# Patient Record
Sex: Male | Born: 1960 | Race: White | Hispanic: No | Marital: Married | Smoking: Current some day smoker
Health system: Southern US, Community
[De-identification: ages and names within clinical notes are randomized; demographics above are authoritative.]

## PROBLEM LIST (undated history)

## (undated) DIAGNOSIS — E785 Hyperlipidemia, unspecified: Secondary | ICD-10-CM

---

## 2019-06-02 DIAGNOSIS — E860 Dehydration: Secondary | ICD-10-CM

## 2019-06-02 DIAGNOSIS — J129 Viral pneumonia, unspecified: Secondary | ICD-10-CM

## 2019-06-02 DIAGNOSIS — N179 Acute kidney failure, unspecified: Secondary | ICD-10-CM | POA: Diagnosis not present

## 2019-06-02 DIAGNOSIS — J189 Pneumonia, unspecified organism: Secondary | ICD-10-CM

## 2019-06-02 DIAGNOSIS — M6282 Rhabdomyolysis: Secondary | ICD-10-CM

## 2019-06-02 DIAGNOSIS — A419 Sepsis, unspecified organism: Secondary | ICD-10-CM

## 2019-06-02 DIAGNOSIS — R740 Nonspecific elevation of levels of transaminase and lactic acid dehydrogenase [LDH]: Secondary | ICD-10-CM

## 2019-06-03 ENCOUNTER — Inpatient Hospital Stay (HOSPITAL_COMMUNITY)
Admission: AD | Admit: 2019-06-03 | Discharge: 2019-06-06 | DRG: 871 | Disposition: A | Discharge status: Home / Self Care | Payer: Commercial Managed Care - PPO | Source: Other Acute Inpatient Hospital | Attending: Family Medicine | Admitting: Family Medicine

## 2019-06-03 DIAGNOSIS — J181 Lobar pneumonia, unspecified organism: Secondary | ICD-10-CM | POA: Diagnosis not present

## 2019-06-03 DIAGNOSIS — J9600 Acute respiratory failure, unspecified whether with hypoxia or hypercapnia: Secondary | ICD-10-CM

## 2019-06-03 DIAGNOSIS — A4189 Other specified sepsis: Principal | ICD-10-CM | POA: Diagnosis present

## 2019-06-03 DIAGNOSIS — F17200 Nicotine dependence, unspecified, uncomplicated: Secondary | ICD-10-CM | POA: Diagnosis present

## 2019-06-03 DIAGNOSIS — F1010 Alcohol abuse, uncomplicated: Secondary | ICD-10-CM | POA: Diagnosis present

## 2019-06-03 DIAGNOSIS — A938 Other specified arthropod-borne viral fevers: Secondary | ICD-10-CM | POA: Diagnosis not present

## 2019-06-03 DIAGNOSIS — W57XXXA Bitten or stung by nonvenomous insect and other nonvenomous arthropods, initial encounter: Secondary | ICD-10-CM | POA: Diagnosis present

## 2019-06-03 DIAGNOSIS — N179 Acute kidney failure, unspecified: Secondary | ICD-10-CM | POA: Diagnosis present

## 2019-06-03 DIAGNOSIS — Z20828 Contact with and (suspected) exposure to other viral communicable diseases: Secondary | ICD-10-CM | POA: Diagnosis present

## 2019-06-03 DIAGNOSIS — A419 Sepsis, unspecified organism: Secondary | ICD-10-CM | POA: Diagnosis not present

## 2019-06-03 DIAGNOSIS — J9601 Acute respiratory failure with hypoxia: Secondary | ICD-10-CM

## 2019-06-03 DIAGNOSIS — E860 Dehydration: Secondary | ICD-10-CM | POA: Diagnosis present

## 2019-06-03 DIAGNOSIS — R7401 Elevation of levels of liver transaminase levels: Secondary | ICD-10-CM | POA: Diagnosis present

## 2019-06-03 DIAGNOSIS — R6520 Severe sepsis without septic shock: Secondary | ICD-10-CM

## 2019-06-03 DIAGNOSIS — M6282 Rhabdomyolysis: Secondary | ICD-10-CM | POA: Diagnosis present

## 2019-06-03 DIAGNOSIS — J1289 Other viral pneumonia: Secondary | ICD-10-CM | POA: Diagnosis present

## 2019-06-03 DIAGNOSIS — E785 Hyperlipidemia, unspecified: Secondary | ICD-10-CM | POA: Diagnosis present

## 2019-06-03 DIAGNOSIS — Z23 Encounter for immunization: Secondary | ICD-10-CM

## 2019-06-03 DIAGNOSIS — K760 Fatty (change of) liver, not elsewhere classified: Secondary | ICD-10-CM | POA: Diagnosis not present

## 2019-06-03 DIAGNOSIS — Z833 Family history of diabetes mellitus: Secondary | ICD-10-CM

## 2019-06-03 DIAGNOSIS — J189 Pneumonia, unspecified organism: Secondary | ICD-10-CM | POA: Diagnosis not present

## 2019-06-03 DIAGNOSIS — E871 Hypo-osmolality and hyponatremia: Secondary | ICD-10-CM | POA: Diagnosis present

## 2019-06-03 DIAGNOSIS — R740 Nonspecific elevation of levels of transaminase and lactic acid dehydrogenase [LDH]: Secondary | ICD-10-CM | POA: Diagnosis not present

## 2019-06-03 HISTORY — DX: Hyperlipidemia, unspecified: E78.5

## 2019-06-03 LAB — CBC WITH DIFFERENTIAL/PLATELET
Abs Immature Granulocytes: 0.09 10*3/uL — ABNORMAL HIGH (ref 0.00–0.07)
Basophils Absolute: 0 10*3/uL (ref 0.0–0.1)
Basophils Relative: 0 %
Eosinophils Absolute: 0 10*3/uL (ref 0.0–0.5)
Eosinophils Relative: 0 %
HCT: 36.2 % — ABNORMAL LOW (ref 39.0–52.0)
Hemoglobin: 12.7 g/dL — ABNORMAL LOW (ref 13.0–17.0)
Immature Granulocytes: 1 %
Lymphocytes Relative: 8 %
Lymphs Abs: 0.8 10*3/uL (ref 0.7–4.0)
MCH: 31.4 pg (ref 26.0–34.0)
MCHC: 35.1 g/dL (ref 30.0–36.0)
MCV: 89.6 fL (ref 80.0–100.0)
Monocytes Absolute: 0.5 10*3/uL (ref 0.1–1.0)
Monocytes Relative: 5 %
Neutro Abs: 9.1 10*3/uL — ABNORMAL HIGH (ref 1.7–7.7)
Neutrophils Relative %: 86 %
Platelets: 192 10*3/uL (ref 150–400)
RBC: 4.04 MIL/uL — ABNORMAL LOW (ref 4.22–5.81)
RDW: 12.5 % (ref 11.5–15.5)
WBC: 10.5 10*3/uL (ref 4.0–10.5)
nRBC: 0 % (ref 0.0–0.2)

## 2019-06-03 LAB — PROCALCITONIN: Procalcitonin: 2.17 ng/mL

## 2019-06-03 LAB — RESPIRATORY PANEL BY PCR

## 2019-06-03 LAB — COMPREHENSIVE METABOLIC PANEL
ALT: 57 U/L — ABNORMAL HIGH (ref 0–44)
AST: 91 U/L — ABNORMAL HIGH (ref 15–41)
Albumin: 2.7 g/dL — ABNORMAL LOW (ref 3.5–5.0)
Alkaline Phosphatase: 38 U/L (ref 38–126)
Anion gap: 10 (ref 5–15)
BUN: 25 mg/dL — ABNORMAL HIGH (ref 6–20)
CO2: 21 mmol/L — ABNORMAL LOW (ref 22–32)
Calcium: 8 mg/dL — ABNORMAL LOW (ref 8.9–10.3)
Chloride: 100 mmol/L (ref 98–111)
Creatinine, Ser: 1.4 mg/dL — ABNORMAL HIGH (ref 0.61–1.24)
GFR calc Af Amer: >60 mL/min (ref >60)
GFR calc non Af Amer: 55 mL/min — ABNORMAL LOW (ref >60)
Glucose, Bld: 169 mg/dL — ABNORMAL HIGH (ref 70–99)
Potassium: 4.5 mmol/L (ref 3.5–5.1)
Sodium: 131 mmol/L — ABNORMAL LOW (ref 135–145)
Total Bilirubin: 1.4 mg/dL — ABNORMAL HIGH (ref 0.3–1.2)
Total Protein: 6.5 g/dL (ref 6.5–8.1)

## 2019-06-03 LAB — TROPONIN I (HIGH SENSITIVITY): Troponin I (High Sensitivity): 14 ng/L (ref <18)

## 2019-06-03 LAB — MRSA PCR SCREENING: MRSA by PCR: NEGATIVE

## 2019-06-03 LAB — BRAIN NATRIURETIC PEPTIDE: B Natriuretic Peptide: 47 pg/mL (ref 0.0–100.0)

## 2019-06-03 LAB — D-DIMER, QUANTITATIVE: D-Dimer, Quant: 3.88 ug/mL-FEU — ABNORMAL HIGH (ref 0.00–0.50)

## 2019-06-03 LAB — LACTATE DEHYDROGENASE: LDH: 348 U/L — ABNORMAL HIGH (ref 98–192)

## 2019-06-03 LAB — FIBRINOGEN: Fibrinogen: >800 mg/dL — ABNORMAL HIGH (ref 210–475)

## 2019-06-03 LAB — SEDIMENTATION RATE: Sed Rate: 77 mm/hr — ABNORMAL HIGH (ref 0–16)

## 2019-06-03 LAB — PROTIME-INR
INR: 1.1 (ref 0.8–1.2)
Prothrombin Time: 13.7 seconds (ref 11.4–15.2)

## 2019-06-03 LAB — C-REACTIVE PROTEIN: CRP: 28.5 mg/dL — ABNORMAL HIGH (ref <1.0)

## 2019-06-03 LAB — SARS CORONAVIRUS 2 BY RT PCR (HOSPITAL ORDER, PERFORMED IN ~~LOC~~ HOSPITAL LAB): SARS Coronavirus 2: NEGATIVE

## 2019-06-03 LAB — INFLUENZA PANEL BY PCR (TYPE A & B)
Influenza A By PCR: NEGATIVE
Influenza B By PCR: NEGATIVE

## 2019-06-03 LAB — LACTIC ACID, PLASMA: Lactic Acid, Venous: 2 mmol/L (ref 0.5–1.9)

## 2019-06-03 LAB — TRIGLYCERIDES: Triglycerides: 213 mg/dL — ABNORMAL HIGH (ref <150)

## 2019-06-03 LAB — FERRITIN: Ferritin: 2789 ng/mL — ABNORMAL HIGH (ref 24–336)

## 2019-06-03 LAB — APTT: aPTT: 33 seconds (ref 24–36)

## 2019-06-03 MED ORDER — ZINC SULFATE 220 (50 ZN) MG PO CAPS
220.0000 mg | ORAL_CAPSULE | Freq: Every day | ORAL | Status: DC
  Administered 2019-06-03 – 2019-06-06 (×4): 220 mg via ORAL
  Filled 2019-06-03 (×4): qty 1

## 2019-06-03 MED ORDER — ORAL CARE MOUTH RINSE
15.0000 mL | Freq: Two times a day (BID) | OROMUCOSAL | Status: DC
  Administered 2019-06-03 – 2019-06-06 (×6): 15 mL via OROMUCOSAL

## 2019-06-03 MED ORDER — ONDANSETRON HCL 4 MG/2ML IJ SOLN: 4.0000 mg | Freq: Four times a day (QID) | INTRAMUSCULAR | Status: DC | PRN

## 2019-06-03 MED ORDER — VITAMIN C 500 MG PO TABS
500.0000 mg | ORAL_TABLET | Freq: Every day | ORAL | Status: DC
  Administered 2019-06-03 – 2019-06-06 (×4): 500 mg via ORAL
  Filled 2019-06-03 (×4): qty 1

## 2019-06-03 MED ORDER — ACETAMINOPHEN 325 MG PO TABS
650.0000 mg | ORAL_TABLET | Freq: Four times a day (QID) | ORAL | Status: DC | PRN
  Filled 2019-06-03: qty 2

## 2019-06-03 MED ORDER — SODIUM CHLORIDE 0.9 % IV SOLN
2.0000 g | INTRAVENOUS | Status: DC
  Administered 2019-06-03 – 2019-06-05 (×3): 2 g via INTRAVENOUS
  Filled 2019-06-03 (×2): qty 2
  Filled 2019-06-03 (×2): qty 20

## 2019-06-03 MED ORDER — LORAZEPAM 1 MG PO TABS: 1.0000 mg | ORAL_TABLET | Freq: Four times a day (QID) | ORAL | Status: DC | PRN

## 2019-06-03 MED ORDER — VITAMIN B-1 100 MG PO TABS
100.0000 mg | ORAL_TABLET | Freq: Every day | ORAL | Status: DC
  Administered 2019-06-03 – 2019-06-06 (×4): 100 mg via ORAL
  Filled 2019-06-03 (×4): qty 1

## 2019-06-03 MED ORDER — FOLIC ACID 1 MG PO TABS
1.0000 mg | ORAL_TABLET | Freq: Every day | ORAL | Status: DC
  Administered 2019-06-03 – 2019-06-06 (×4): 1 mg via ORAL
  Filled 2019-06-03 (×4): qty 1

## 2019-06-03 MED ORDER — ENOXAPARIN SODIUM 60 MG/0.6ML ~~LOC~~ SOLN
55.0000 mg | SUBCUTANEOUS | Status: DC
  Administered 2019-06-03 – 2019-06-05 (×3): 55 mg via SUBCUTANEOUS
  Filled 2019-06-03 (×3): qty 0.6

## 2019-06-03 MED ORDER — GUAIFENESIN-DM 100-10 MG/5ML PO SYRP
10.0000 mL | ORAL_SOLUTION | ORAL | Status: DC | PRN
  Filled 2019-06-03: qty 10

## 2019-06-03 MED ORDER — DOXYCYCLINE HYCLATE 100 MG PO TABS
100.0000 mg | ORAL_TABLET | Freq: Two times a day (BID) | ORAL | Status: DC
  Administered 2019-06-03 – 2019-06-06 (×6): 100 mg via ORAL
  Filled 2019-06-03 (×6): qty 1

## 2019-06-03 MED ORDER — LORAZEPAM 2 MG/ML IJ SOLN
0.0000 mg | Freq: Four times a day (QID) | INTRAMUSCULAR | Status: AC
  Administered 2019-06-04: 2 mg via INTRAVENOUS
  Filled 2019-06-03: qty 1

## 2019-06-03 MED ORDER — THIAMINE HCL 100 MG/ML IJ SOLN
100.0000 mg | Freq: Every day | INTRAMUSCULAR | Status: DC
  Filled 2019-06-03: qty 2

## 2019-06-03 MED ORDER — PNEUMOCOCCAL VAC POLYVALENT 25 MCG/0.5ML IJ INJ
0.5000 mL | INJECTION | INTRAMUSCULAR | Status: AC
  Administered 2019-06-04: 0.5 mL via INTRAMUSCULAR

## 2019-06-03 MED ORDER — SODIUM CHLORIDE 0.9 % IV SOLN
INTRAVENOUS | Status: DC
  Administered 2019-06-03: via INTRAVENOUS

## 2019-06-03 MED ORDER — ONDANSETRON HCL 4 MG PO TABS: 4.0000 mg | ORAL_TABLET | Freq: Four times a day (QID) | ORAL | Status: DC | PRN

## 2019-06-03 MED ORDER — LORAZEPAM 2 MG/ML IJ SOLN: 1.0000 mg | Freq: Four times a day (QID) | INTRAMUSCULAR | Status: DC | PRN

## 2019-06-03 MED ORDER — SODIUM CHLORIDE 0.9% FLUSH
3.0000 mL | Freq: Two times a day (BID) | INTRAVENOUS | Status: DC
  Administered 2019-06-03 – 2019-06-06 (×6): 3 mL via INTRAVENOUS

## 2019-06-03 MED ORDER — ADULT MULTIVITAMIN W/MINERALS CH
1.0000 | ORAL_TABLET | Freq: Every day | ORAL | Status: DC
  Administered 2019-06-03 – 2019-06-06 (×4): 1 via ORAL
  Filled 2019-06-03 (×4): qty 1

## 2019-06-03 MED ORDER — HYDROCODONE-ACETAMINOPHEN 5-325 MG PO TABS: 1.0000 | ORAL_TABLET | ORAL | Status: DC | PRN

## 2019-06-03 MED ORDER — LORAZEPAM 2 MG/ML IJ SOLN
0.0000 mg | Freq: Two times a day (BID) | INTRAMUSCULAR | Status: DC
  Administered 2019-06-06: 1 mg via INTRAVENOUS
  Filled 2019-06-03: qty 1

## 2019-06-03 NOTE — H&P (Signed)
[name] ESP:[id number] DOB: 21-May-1961 DOA: 06/03/2019     PCP: Patient, No Pcp Per   Outpatient Specialists:  NONE    Patient coming from: home Lives  With family    Chief Complaint: transfer from [place] for possible Covid  HPI: Kenneth Mejia is a 58 y.o. male with medical history significant of HLD, tobacco abuse    Presented with   with  flu-like symptoms and febrile with a T-max of 106 to [place]  prior  patient had outpatient COVID swab done 4 days prior to admission that was negative.  Rapid swab done  At [facility] also negative however blood work very suspicious  ferritin elevated at 1150, LDH elevated at 993, total creatinine kinase 2472, C-reactive protein greater than 270 with left-sided pneumonia seen on imaging.  Given worsening inflammatory  markers and febrile state transfered to [facility] for further care.   Continues to have a cough  Got sick on 8/12, he works as a Mining engineer,  Not much exposure to people family been healthy His coworker had a ride with him  Brief narrative/HPI from [place]  Patient    presented to the ER after worsening of flu-like symptoms ongoing for 4 days.  Symptoms include loss of taste, fevers, body aches with nausea and vomiting.  Patient had outpatient COVID testing performed 24 hours after symptom onset with test results pending.  Fever in triage was 103.4.  Patient was not hypoxic at rest although while febrile was quite diaphoretic.  Chest x-ray revealed left middle lobe pneumonia.  Multiple inflammatory markers were elevated, CPK elevated.  COVID-19 rapid screen was negative.  Patient has been started on Zithromax and Rocephin for community-acquired pneumonia.  Of note white count is normal with left shift as well as lymphopenia.  O2 sats on room air 93%.  In addition to mentioned lab abnormalities patient had hyponatremia and acute kidney injury with a creatinine of 1.3.    Hospital course As above patient  presented to the emergency department with complaints of worsening flu-like symptoms and significantly elevated temperature with T-max of 106 prior to admission.  Workup during admission has revealed left sided pneumonia/possible viral pneumonia with acute kidney injury and mild rhabdomyolysis.  Per both patient and family symptoms began last Wednesday with progressively worsening temperature and mild confusion prior to admission.  Patient reports cough, and mild shortness of breath with associated fever.  He denies any known COVID-19 exposure, spouse denies any additional family members with any symptoms as well.  Inflammatory markers remain elevated including ferritin greater than 1150, LDH of 993, CRP greater than 270        SIRS/ Mild non hypoxemic respiratory failure secondary to left-sided pneumonia/viral pneumonia   with associated acute kidney injury and rhabdomyolysis. SARS-CoV-2 RNA PCR negative but high index of suspicion that this may be a false negative therefore nursing staff have initiated appropriate isolation procedures and -patient will be retested for COVID within the next 24-48 hours especially if symptoms do not improve or inflammatory markers worsen -Procalcitonin elevated at 1.91 > 3.20 -continue to trend -LDH 691 > 993 -total creatinine kinase 1157 > 2472 -CRP greater than 270 x2 -Continue IV Rocephin and Zithromax -continue IV hydration -IV Solu-Medrol 40 mg q.12 hours to treat possible COVID etiology to symptoms -Obtain respiratory viral profile to rule out influenza or other respiratory viral etiology -Lactic acid 1.2 > 1.2 >3.1 > 2.4 > 1.6 -Tylenol, Zofran and Phenergan for symptom management  Acute kidney injury/rhabdomyolysis Creatinine on admission 1.3 with no baseline labs available but patient and family deny any history of renal disease -8/17 creatinine remains elevated at 1.30 with GFR 57 -continue IV fluid hydration -Avoid offending medications -urine  myoglobin pending -serum myoglobin 599 -total creatinine kinase 1157 > 2472  Transaminitis Patient admits to drinking 2 cans of beer a day Mild elevation in total bilirubin in AST likely related to alcohol use although patient states he has not had any alcoholic beverages for 5 days -Continue to trend-potentially related to infectious process/dehydration -CIWA scale q.4 hours   tick bite was 2 nd July Both patient and family report recent imbedded tick bite to back area with rash. -continue doxycycline -Rocky mountain spotted fever and lyme disease workup pending  Dehydration with hyponatremia IV fluids given    Consultants: None   Microbiological data: SARS-CoV-2 RNA PCR negative Blood cultures pending  Antibiotics:  8/16 IV Rocephin and Zithromax > 8/16 8/16 IV doxycycline    Radiological studies: Chest x-ray Left midlung consolidation, consistent with pneumonia. Recommend chest radiographic follow-up to confirm resolution.   Infectious risk factors:  Reports  fever, shortness of breath, dry cough, chest pain,  anosmia/change in taste, N/V/Diarrhea/abdominal pain,  Body aches, severe fatigue     In  ER RAPID COVID TEST NEGATIVE repeatedly but high suspicion   Regarding pertinent Chronic problems:     Hyperlipidemia - diet controlled    While in ER:  The following Work up has been ordered so far:  Orders Placed This Encounter  Procedures   MRSA PCR Screening   Culture, blood (Routine X 2) w Reflex to ID Panel   Culture, sputum-assessment   SARS Coronavirus 2 [facility] order, Performed in [facility] hospital lab) Nasopharyngeal Nasopharyngeal Swab   Respiratory Panel by PCR   US Abdomen Limited RUQ   HIV antibody (Routine Testing)   CBC with Differential/Platelet   Comprehensive metabolic panel   C-reactive protein   CK   D-dimer, quantitative (not at [facility])   Ferritin   Interleukin-6, Plasma   Magnesium   Strep pneumoniae urinary  antigen   Influenza panel by PCR (type A & B)   Brain natriuretic peptide   C-reactive protein   Comprehensive metabolic panel   CBC with Differential/Platelet   D-dimer, quantitative (not at [facility])   Ferritin   Fibrinogen   Hepatitis B surface antigen   Interleukin-6, Plasma   Lactate dehydrogenase   Procalcitonin   Sedimentation rate   Triglycerides   Phosphorus   Rocky mtn spotted fvr abs pnl(IgG+IgM)   Ehrlichia antibody panel   Lactic acid, plasma   Protime-INR   APTT   Hepatitis panel, acute   Prealbumin   Lipid panel   Diet Heart Room service appropriate? Yes; Fluid consistency: Thin   Cardiac monitoring   Complete oral assessment tool on admission, transfer and at any change in patient condition.   Oral care with mouthwash and mouth/lip moisturizer as needed   Perform a daily oral assessment to evaluate integrity of the oral cavity/mucosa   Cardiac Monitoring - Continuous Indefinite   Maintain IV access   Notify physician   Up with assistance   Novel Coronavirus PPE supplies (droplet and contact precautions) yellow stethoscopes, surgical mask, gowns, surgical caps, face shield, goggles, CAPR - on the floor/unit, cleaning Sani-Cloth (orange and purple top)   Place COVID-19 isolation sign and PPE checklist outside the DOOR   Do not give nonsteroidal anti-inflammatory drugs (NSAIDs)   Patient  to wear surgical mask during transportation   Place working phone next to the patient   Assess patient for ability to self-prone. If able (can move self in bed, ambulate) and stable (SpO2 and oxygen requirement):   May go off telemetry for tests/procedures   Initiate Oral Care Protocol   Initiate Carrier Fluid Protocol   RN may order General Admission PRN Orders utilizing "General Admission PRN medications" (through manage orders) for the following patient needs: allergy symptoms (Claritin), cold sores (Carmex), cough (Robitussin DM), eye  irritation (Liquifilm Tears), hemorrhoids (Tucks), indigestion (Maalox), minor skin irritation (Hydrocortisone Cream), muscle pain Suezanne Jacquet Gay), nose irritation (saline nasal spray) and sore throat (Chloraseptic spray).   Notify MD for:   [place] Withdrawal Assessment (CIWA)   Notify MD if CIWA-AR is greater than 10 for 2 consecutive assessments.   If lactate (lactic acid) >2, verify repeat lactic acid order has been placed to be drawn   Document vital signs within 1-hour of fluid bolus completion and notify provider of bolus completion   Refer to Sidebar Report: Sepsis Sidebar   Vital signs   Vital signs   Vital signs   Full code   Pharmacy Consult   Airborne and Contact precautions   Pulse oximetry check with vital signs   Flutter valve   Oxygen therapy Mode or (Route): Nasal cannula; Liters Per Minute: 2; Keep 02 saturation: greater than 92 %   EKG 12-Lead   ABO/Rh   Admit to Inpatient (patient's expected length of stay will be greater than 2 midnights or inpatient only procedure)     Following Medications were ordered in ER: Medications  pneumococcal 23 valent vaccine (PNU-IMMUNE) injection 0.5 mL (has no administration in time range)  MEDLINE mouth rinse (15 mLs Mouth Rinse Given 06/03/19 2228)  enoxaparin (LOVENOX) injection 55 mg (55 mg Subcutaneous Given 06/03/19 2342)  sodium chloride flush (NS) 0.9 % injection 3 mL (3 mLs Intravenous Given 06/03/19 2228)  guaiFENesin-dextromethorphan (ROBITUSSIN DM) 100-10 MG/5ML syrup 10 mL (has no administration in time range)  vitamin C (ASCORBIC ACID) tablet 500 mg (500 mg Oral Given 06/03/19 2223)  zinc sulfate capsule 220 mg (220 mg Oral Given 06/03/19 2228)  acetaminophen (TYLENOL) tablet 650 mg (has no administration in time range)  HYDROcodone-acetaminophen (NORCO/VICODIN) 5-325 MG per tablet 1-2 tablet (has no administration in time range)  ondansetron (ZOFRAN) tablet 4 mg (has no administration in time range)      Or  ondansetron (ZOFRAN) injection 4 mg (has no administration in time range)  0.9 %  sodium chloride infusion ( Intravenous New Bag/Given 06/03/19 2336)  cefTRIAXone (ROCEPHIN) 2 g in sodium chloride 0.9 % 100 mL IVPB (2 g Intravenous New Bag/Given 06/03/19 2336)  doxycycline (VIBRA-TABS) tablet 100 mg (100 mg Oral Given 06/03/19 2223)  LORazepam (ATIVAN) tablet 1 mg (has no administration in time range)    Or  LORazepam (ATIVAN) injection 1 mg (has no administration in time range)  thiamine (VITAMIN B-1) tablet 100 mg (100 mg Oral Given 06/03/19 2223)    Or  thiamine (B-1) injection 100 mg ( Intravenous See Alternative 1/91/47 8295)  folic acid (FOLVITE) tablet 1 mg (1 mg Oral Given 06/03/19 2224)  multivitamin with minerals tablet 1 tablet (1 tablet Oral Given 06/03/19 2223)  LORazepam (ATIVAN) injection 0-4 mg (0 mg Intravenous Not Given 06/03/19 2228)    Followed by  LORazepam (ATIVAN) injection 0-4 mg (has no administration in time range)         Significant  initial  Findings: Abnormal Labs Reviewed  C-REACTIVE PROTEIN - Abnormal; Notable for the following components:      Result Value   CRP 28.5 (*)    All other components within normal limits  COMPREHENSIVE METABOLIC PANEL - Abnormal; Notable for the following components:   Sodium 131 (*)    CO2 21 (*)    Glucose, Bld 169 (*)    BUN 25 (*)    Creatinine, Ser 1.40 (*)    Calcium 8.0 (*)    Albumin 2.7 (*)    AST 91 (*)    ALT 57 (*)    Total Bilirubin 1.4 (*)    GFR calc non Af Amer 55 (*)    All other components within normal limits  CBC WITH DIFFERENTIAL/PLATELET - Abnormal; Notable for the following components:   RBC 4.04 (*)    Hemoglobin 12.7 (*)    HCT 36.2 (*)    Neutro Abs 9.1 (*)    Abs Immature Granulocytes 0.09 (*)    All other components within normal limits  D-DIMER, QUANTITATIVE (NOT AT [facility]) - Abnormal; Notable for the following components:   D-Dimer, Quant 3.88 (*)    All other components within  normal limits  FERRITIN - Abnormal; Notable for the following components:   Ferritin 2,789 (*)    All other components within normal limits  FIBRINOGEN - Abnormal; Notable for the following components:   Fibrinogen >800 (*)    All other components within normal limits  LACTATE DEHYDROGENASE - Abnormal; Notable for the following components:   LDH 348 (*)    All other components within normal limits  SEDIMENTATION RATE - Abnormal; Notable for the following components:   Sed Rate 77 (*)    All other components within normal limits  TRIGLYCERIDES - Abnormal; Notable for the following components:   Triglycerides 213 (*)    All other components within normal limits  LACTIC ACID, PLASMA - Abnormal; Notable for the following components:   Lactic Acid, Venous 2.0 (*)    All other components within normal limits     Otherwise labs showing:    Recent Labs  Lab 06/03/19 2220  NA 131*  K 4.5  CO2 21*  GLUCOSE 169*  BUN 25*  CREATININE 1.40*  CALCIUM 8.0*    Cr    stable,   Lab Results  Component Value Date   CREATININE 1.40 (H) 06/03/2019    Recent Labs  Lab 06/03/19 2220  AST 91*  ALT 57*  ALKPHOS 38  BILITOT 1.4*  PROT 6.5  ALBUMIN 2.7*   Lab Results  Component Value Date   CALCIUM 8.0 (L) 06/03/2019       WBC      Component Value Date/Time   WBC 10.5 06/03/2019 2220   ANC    Component Value Date/Time   NEUTROABS 9.1 (H) 06/03/2019 2220   ALC 0.8     Plt: Lab Results  Component Value Date   PLT 192 06/03/2019     Lactic Acid, Venous    Component Value Date/Time   LATICACIDVEN 1.8 06/03/2019 2359    Procalcitonin 2.17   COVID-19 Labs  Recent Labs    06/03/19 2220  DDIMER 3.88*  FERRITIN 2,789*  LDH 348*  CRP 28.5*    Lab Results  Component Value Date   SARSCOV2NAA NEGATIVE 06/03/2019       HG/HCT  stable,      Component Value Date/Time   HGB 12.7 (L) 06/03/2019 2220  HCT 36.2 (L) 06/03/2019 2220      Troponin 12>  14 Cardiac Panel (last 3 results)    BNP (last 3 results) Recent Labs    06/03/19 2220  BNP 47.0      UA ordered       ED Triage Vitals [06/03/19 2023]  Enc Vitals Group     BP 114/75     Pulse Rate 78     Resp (!) 24     Temp 98.2 F (36.8 C)     Temp Source Oral     SpO2 93 %     Weight 249 lb 1.9 oz (113 kg)     Height 6' (1.829 m)     Head Circumference      Peak Flow      Pain Score 0     Pain Loc      Pain Edu?      Excl. in [place]?   NIDP(82)@       Latest  Blood pressure 114/75, pulse 78, temperature 98.2 F (36.8 C), temperature source Oral, resp. rate (!) 24, height 6' (1.829 m), weight 113 kg, SpO2 93 %.     Hospitalist was called for admission for PNA with elevated infectious markers   Review of Systems:    Pertinent positives include: Fevers, chills, fatigue, shortness of breath at rest. dyspnea on exertion, Constitutional:  No weight loss, night sweats,  weight loss  HEENT:  No headaches, Difficulty swallowing,Tooth/dental problems,Sore throat,  No sneezing, itching, ear ache, nasal congestion, post nasal drip,  Cardio-vascular:  No chest pain, Orthopnea, PND, anasarca, dizziness, palpitations.no Bilateral lower extremity swelling  GI:  No heartburn, indigestion, abdominal pain, nausea, vomiting, diarrhea, change in bowel habits, loss of appetite, melena, blood in stool, hematemesis Resp:     No excess mucus, no productive cough, No non-productive cough, No coughing up of blood.No change in color of mucus.No wheezing. Skin:  no rash or lesions. No jaundice GU:  no dysuria, change in color of urine, no urgency or frequency. No straining to urinate.  No flank pain.  Musculoskeletal:  No joint pain or no joint swelling. No decreased range of motion. No back pain.  Psych:  No change in mood or affect. No depression or anxiety. No memory loss.  Neuro: no localizing neurological complaints, no tingling, no weakness, no double vision, no gait  abnormality, no slurred speech, no confusion  All systems reviewed and apart from [place] all are negative  Past Medical History:   Past Medical History:  Diagnosis Date   Hyperlipidemia       History reviewed. No pertinent surgical history.  Social History:  Ambulatory independently     reports that he has been smoking. He has never used smokeless tobacco. He reports current alcohol use of about 14.0 standard drinks of alcohol per week. No history on file for drug.   Family History:   Family History  Problem Relation Age of Onset   Diabetes Mother    Ovarian cancer Mother    Breast cancer Mother    Breast cancer Sister     Allergies: No Known Allergies   Prior to Admission medications   Not on File   Physical Exam: Blood pressure 114/75, pulse 78, temperature 98.2 F (36.8 C), temperature source Oral, resp. rate (!) 24, height 6' (1.829 m), weight 113 kg, SpO2 93 %. 1. General:  in No Acute distress   well  -appearing 2. Psychological: Alert and   Oriented  3. Head/ENT:     Dry Mucous Membranes                          Head Non traumatic, neck supple                            Poor Dentition 4. SKIN:   decreased Skin turgor,  Skin clean Dry and intact no rash 5. Heart: Regular rate and rhythm no  Murmur, no Rub or gallop 6. Lungs:   no wheezes some  crackles   7. Abdomen: Soft, non-tender, Non distended  obese  bowel sounds present 8. Lower extremities: no clubbing, cyanosis, no  edema 9. Neurologically Grossly intact, moving all 4 extremities equally  10. MSK: Normal range of motion   All other LABS:     Recent Labs  Lab 06/03/19 2220  WBC 10.5  NEUTROABS 9.1*  HGB 12.7*  HCT 36.2*  MCV 89.6  PLT 192     Recent Labs  Lab 06/03/19 2220  NA 131*  K 4.5  CL 100  CO2 21*  GLUCOSE 169*  BUN 25*  CREATININE 1.40*  CALCIUM 8.0*     Recent Labs  Lab 06/03/19 2220  AST 91*  ALT 57*  ALKPHOS 38  BILITOT 1.4*  PROT 6.5  ALBUMIN 2.7*        Cultures: No results found for: SDES, SPECREQUEST, CULT, REPTSTATUS   Radiological Exams on Admission: No results found.  Chart has been reviewed    Assessment/Plan  58 y.o. male with medical history significant of HLD, tobacco abuse     Admitted for PNA with elevated infectious markers  Present on Admission:  Sepsis ([place]) -  -SIRS criteria met with  fever.    With  vidence of end organ damage such as acute renal failure elevated lactic acid   -Most likely source being: pulmonary,   - Obtain serial lactic acid and procalcitonin level.  - Initiate IV antibiotics   - await results of blood and urine culture  - Rehydrate   Check HIV serologies  Acute respiratory failure with hypoxia - likely due to CAP,  Pt was on steroids for presumed Covid Would continue given worsening hypoxia Please reassess in AM  despite negative COVID result Given elevated pro calcitonin - bactereal infection could not be ruled out at this time Will repeat imaging given new hypoxia    CAP (community acquired pneumonia) -  - will admit for treatment of CAP will start on appropriate antibiotic coverage. Continue on airborne precautions for now given risk factors for COVID with history concerning    Obtain:  sputum cultures,                  Obtain respiratory panel and influenza serologies                  blood cultures                    strep pneumo UA antigen,                Provide oxygen as needed.    Tick borne fever  -prior history of tick exposure cannot rule out tickborne illness.  Continue with doxycycline for now   Rhabdomyolysis - noted to have elevated CK at [place] will continue to follow and rehydrate   AKI (acute kidney injury) ([place]) - unclear cr  at baseline, obtain urine elctrolytes and gently rehydrate   Transaminitis -we will further evaluate with right upper quadrant ultrasound tomorrow, hepatitis serology.  Patient states she only drinks 2 beers a day, LFT elevation  could be in the setting of viral infectious process as  well,    Elevated d-dimer -likely part of inflammatory process but patient would benefit from further imaging to evaluate pneumonia once creatinine stabilized could obtain CTA of the chest to eval for PE  Other plan as per orders.  DVT prophylaxis:  Lovenox     Code Status:  FULL CODE   as per patient   I had personally discussed CODE STATUS with patient   Family Communication:   Family not at  Bedside    Disposition Plan:     To home once workup is complete and patient is stable                                         Consults called: none  Admission status:  ED Disposition    None         inpatient     Expect 2 midnight stay secondary to severity of patient's current illness including   hemodynamic instability despite optimal treatment (tachycardia   Tachypnea )  Severe lab/radiological/exam abnormalities including:  CAP      I expect  patient to be hospitalized for 2 midnights requiring inpatient medical care.  Patient is at high risk for adverse outcome (such as loss of life or disability) if not treated.  Indication for inpatient stay as follows:    Hemodynamic instability despite maximal medical therapy,    Acute hypoxia  Need for IV antibiotics, IV fluids      Level of care    SDU tele indefinitely please discontinue once patient no longer qualifies  Precautions:   airborne Airborne and Contact precautions  PPE: Used by the provider:   P100  eye Goggles,  Gloves  gown      Kenneth Mejia 06/04/2019, 2:25 AM    [facility]     after 2 AM please page floor coverage PA If 7AM-7PM, please contact the day team taking care of the patient using [email or web address]

## 2019-06-04 ENCOUNTER — Encounter (HOSPITAL_COMMUNITY): Payer: Self-pay

## 2019-06-04 ENCOUNTER — Inpatient Hospital Stay (HOSPITAL_COMMUNITY): Payer: Commercial Managed Care - PPO

## 2019-06-04 ENCOUNTER — Other Ambulatory Visit: Payer: Self-pay

## 2019-06-04 DIAGNOSIS — N179 Acute kidney failure, unspecified: Secondary | ICD-10-CM | POA: Diagnosis present

## 2019-06-04 DIAGNOSIS — R7401 Elevation of levels of liver transaminase levels: Secondary | ICD-10-CM | POA: Diagnosis present

## 2019-06-04 DIAGNOSIS — M6282 Rhabdomyolysis: Secondary | ICD-10-CM | POA: Diagnosis present

## 2019-06-04 LAB — HIV ANTIBODY (ROUTINE TESTING W REFLEX): HIV Screen 4th Generation wRfx: NONREACTIVE

## 2019-06-04 LAB — EXPECTORATED SPUTUM ASSESSMENT W GRAM STAIN, RFLX TO RESP C: Special Requests: NORMAL

## 2019-06-04 LAB — CBC WITH DIFFERENTIAL/PLATELET
Abs Immature Granulocytes: 0 10*3/uL (ref 0.00–0.07)
Basophils Absolute: 0 10*3/uL (ref 0.0–0.1)
Basophils Relative: 0 %
Eosinophils Absolute: 0 10*3/uL (ref 0.0–0.5)
Eosinophils Relative: 0 %
HCT: 34.5 % — ABNORMAL LOW (ref 39.0–52.0)
Hemoglobin: 12.3 g/dL — ABNORMAL LOW (ref 13.0–17.0)
Lymphocytes Relative: 4 %
Lymphs Abs: 0.4 10*3/uL — ABNORMAL LOW (ref 0.7–4.0)
MCH: 31.7 pg (ref 26.0–34.0)
MCHC: 35.7 g/dL (ref 30.0–36.0)
MCV: 88.9 fL (ref 80.0–100.0)
Monocytes Absolute: 0.8 10*3/uL (ref 0.1–1.0)
Monocytes Relative: 9 %
Neutro Abs: 8.2 10*3/uL — ABNORMAL HIGH (ref 1.7–7.7)
Neutrophils Relative %: 87 %
Platelets: 196 10*3/uL (ref 150–400)
RBC: 3.88 MIL/uL — ABNORMAL LOW (ref 4.22–5.81)
RDW: 12.6 % (ref 11.5–15.5)
WBC: 9.4 10*3/uL (ref 4.0–10.5)
nRBC: 0 % (ref 0.0–0.2)
nRBC: 1 /100 WBC — ABNORMAL HIGH

## 2019-06-04 LAB — PHOSPHORUS: Phosphorus: 2.5 mg/dL (ref 2.5–4.6)

## 2019-06-04 LAB — COMPREHENSIVE METABOLIC PANEL
ALT: 58 U/L — ABNORMAL HIGH (ref 0–44)
AST: 79 U/L — ABNORMAL HIGH (ref 15–41)
Albumin: 2.5 g/dL — ABNORMAL LOW (ref 3.5–5.0)
Alkaline Phosphatase: 37 U/L — ABNORMAL LOW (ref 38–126)
Anion gap: 10 (ref 5–15)
BUN: 23 mg/dL — ABNORMAL HIGH (ref 6–20)
CO2: 20 mmol/L — ABNORMAL LOW (ref 22–32)
Calcium: 8 mg/dL — ABNORMAL LOW (ref 8.9–10.3)
Chloride: 102 mmol/L (ref 98–111)
Creatinine, Ser: 1.25 mg/dL — ABNORMAL HIGH (ref 0.61–1.24)
GFR calc Af Amer: >60 mL/min (ref >60)
GFR calc non Af Amer: >60 mL/min (ref >60)
Glucose, Bld: 178 mg/dL — ABNORMAL HIGH (ref 70–99)
Potassium: 4 mmol/L (ref 3.5–5.1)
Sodium: 132 mmol/L — ABNORMAL LOW (ref 135–145)
Total Bilirubin: 1.1 mg/dL (ref 0.3–1.2)
Total Protein: 6.1 g/dL — ABNORMAL LOW (ref 6.5–8.1)

## 2019-06-04 LAB — TROPONIN I (HIGH SENSITIVITY): Troponin I (High Sensitivity): 12 ng/L (ref <18)

## 2019-06-04 LAB — STREP PNEUMONIAE URINARY ANTIGEN: Strep Pneumo Urinary Antigen: NEGATIVE

## 2019-06-04 LAB — LIPID PANEL
Cholesterol: 148 mg/dL (ref 0–200)
HDL: 13 mg/dL — ABNORMAL LOW (ref >40)
LDL Cholesterol: 84 mg/dL (ref 0–99)
Total CHOL/HDL Ratio: 11.4 RATIO
Triglycerides: 257 mg/dL — ABNORMAL HIGH (ref <150)
VLDL: 51 mg/dL — ABNORMAL HIGH (ref 0–40)

## 2019-06-04 LAB — D-DIMER, QUANTITATIVE: D-Dimer, Quant: 3.13 ug/mL-FEU — ABNORMAL HIGH (ref 0.00–0.50)

## 2019-06-04 LAB — FERRITIN: Ferritin: 2746 ng/mL — ABNORMAL HIGH (ref 24–336)

## 2019-06-04 LAB — ABO/RH: ABO/RH(D): A POS

## 2019-06-04 LAB — MAGNESIUM: Magnesium: 2.2 mg/dL (ref 1.7–2.4)

## 2019-06-04 LAB — C-REACTIVE PROTEIN: CRP: 21.8 mg/dL — ABNORMAL HIGH (ref <1.0)

## 2019-06-04 LAB — CK: Total CK: 1266 U/L — ABNORMAL HIGH (ref 49–397)

## 2019-06-04 LAB — PREALBUMIN: Prealbumin: 6.5 mg/dL — ABNORMAL LOW (ref 18–38)

## 2019-06-04 LAB — LACTIC ACID, PLASMA: Lactic Acid, Venous: 1.8 mmol/L (ref 0.5–1.9)

## 2019-06-04 MED ORDER — METHYLPREDNISOLONE SODIUM SUCC 125 MG IJ SOLR
60.0000 mg | Freq: Two times a day (BID) | INTRAMUSCULAR | Status: DC
  Administered 2019-06-04 – 2019-06-06 (×5): 60 mg via INTRAVENOUS
  Filled 2019-06-04 (×5): qty 2

## 2019-06-04 MED ORDER — PREDNISONE 20 MG PO TABS: 40.0000 mg | ORAL_TABLET | Freq: Every day | ORAL | Status: DC

## 2019-06-04 NOTE — Progress Notes (Signed)
PROGRESS NOTE    Kenneth Mejia  ZOX:[id number] DOB: 03/23/1961 DOA: 06/03/2019 PCP: Patient, No Pcp Per     Brief Narrative:  Kenneth Mejia is a 58 year old male who transferred to a skilled hospital from [place].  He has past medical history significant for HLD, tobacco abuse.  He initially presented with flulike symptoms to [facility].  He had an outpatient COVID swab which was done 4 days prior to admission that was negative.  Rapid swab at [facility] also negative, however he did have elevated inflammatory markers which raised the suspicion for COVID-19.  Ferritin 1150 LDH 993 CK 2472 CRP greater than 270 Imaging revealed left-sided pneumonia  He also admitted to symptoms such as loss of taste, fevers, body aches, nausea, vomiting.  Fever at [facility] was 103.4.  He was started ceftriaxone and Zithromax for CAP.  New events last 24 hours / Subjective: No acute issues.  States that he is doing okay.  No worsening shortness of breath.  Assessment & Plan:   Active Problems:   Sepsis (HCC)   CAP (community acquired pneumonia)   Tick borne fever   Rhabdomyolysis   AKI (acute kidney injury) (HCC)   Transaminitis   Suspicion for COVID-19 -He tested negative 3 times as an outpatient, at [facility] and at [facility].  However patient symptoms and elevated inflammatory markers are suspicion for COVID 19.  He remains in airborne isolation at this time -Respiratory viral panel, influenza negative, HIV and strep pneumo antigen negative -Sputum culture pending -Chest x-ray personally reviewed, worsening consolidation in the left upper and midlung fields -Continue Solu-Medrol -Currently on Rocephin, doxycycline for CAP coverage.  Procalcitonin was elevated at [facility] up to 3.2  Acute hypoxemic respiratory failure -Currently on 3 liter nasal cannula O2.  Wean as able  AKI -Creatinine on admission was 1.3, no baseline labs available -Remains a stable  1.25 today  Mild rhabdomyolysis -Continue IV fluid, trend CK, improving this morning 1266  Alcohol abuse with transaminitis -Right upper quadrant ultrasound showed fatty liver, no acute findings -LFTs improving -CIWA protocol  Tick bite -Report of tick bite on July 2.  Has [facility] spotted fever, Lyme disease work-up pending from [facility] prior to transfer. Started on doxycyline there    DVT prophylaxis: Lovenox Code Status: Full code Family Communication: None Disposition Plan: Pending clinical improvement   Consultants:   None  Procedures:   None  Antimicrobials:  Anti-infectives (From admission, onward)   Start     Dose/Rate Route Frequency Ordered Stop   06/03/19 2200  cefTRIAXone (ROCEPHIN) 2 g in sodium chloride 0.9 % 100 mL IVPB     2 g 200 mL/hr over 30 Minutes Intravenous Every 24 hours 06/03/19 2131 06/08/19 2159   06/03/19 2200  doxycycline (VIBRA-TABS) tablet 100 mg     100 mg Oral Every 12 hours 06/03/19 2131          Objective: Vitals:   06/04/19 0600 06/04/19 0607 06/04/19 0608 06/04/19 0800  BP: 98/68   102/60  Pulse:  87  (!) 101  Resp: (!) 22  20 (!) 24  Temp:    98.7 F (37.1 C)  TempSrc:    Oral  SpO2:  93%  93%  Weight:      Height:        Intake/Output Summary (Last 24 hours) at 06/04/2019 1349 Last data filed at 06/04/2019 1000 Gross per 24 hour  Intake 1201.83 ml  Output 850 ml  Net  351.83 ml   Filed Weights   06/03/19 2023  Weight: 113 kg    Examination:  General exam: Appears calm and comfortable  Respiratory system: Clear to auscultation. Respiratory effort normal. No respiratory distress. No conversational dyspnea.  Cardiovascular system: S1 & S2 heard, RRR. No murmurs. No pedal edema. Gastrointestinal system: Abdomen is nondistended, soft and nontender. Normal bowel sounds heard. Central nervous system: Alert and oriented. No focal neurological deficits. Speech clear.  Extremities: Symmetric in appearance   Skin: No rashes, lesions or ulcers on exposed skin  Psychiatry: Judgement and insight appear normal. Mood & affect appropriate.   Data Reviewed: I have personally reviewed following labs and imaging studies  CBC: Recent Labs  Lab 06/03/19 2220 06/04/19 0438  WBC 10.5 9.4  NEUTROABS 9.1* 8.2*  HGB 12.7* 12.3*  HCT 36.2* 34.5*  MCV 89.6 88.9  PLT 192 196   Basic Metabolic Panel: Recent Labs  Lab 06/03/19 2220 06/04/19 0438  NA 131* 132*  K 4.5 4.0  CL 100 102  CO2 21* 20*  GLUCOSE 169* 178*  BUN 25* 23*  CREATININE 1.40* 1.25*  CALCIUM 8.0* 8.0*  MG  --  2.2  PHOS  --  2.5   GFR: Estimated Creatinine Clearance: 83.6 mL/min (A) (by C-G formula based on SCr of 1.25 mg/dL (H)). Liver Function Tests: Recent Labs  Lab 06/03/19 2220 06/04/19 0438  AST 91* 79*  ALT 57* 58*  ALKPHOS 38 37*  BILITOT 1.4* 1.1  PROT 6.5 6.1*  ALBUMIN 2.7* 2.5*   No results for input(s): LIPASE, AMYLASE in the last 168 hours. No results for input(s): AMMONIA in the last 168 hours. Coagulation Profile: Recent Labs  Lab 06/03/19 2220  INR 1.1   Cardiac Enzymes: Recent Labs  Lab 06/04/19 0438  CKTOTAL 1,266*   BNP (last 3 results) No results for input(s): PROBNP in the last 8760 hours. HbA1C: No results for input(s): HGBA1C in the last 72 hours. CBG: No results for input(s): GLUCAP in the last 168 hours. Lipid Profile: Recent Labs    06/03/19 2220 06/04/19 0438  CHOL  --  148  HDL  --  13*  LDLCALC  --  84  TRIG 213* 257*  CHOLHDL  --  11.4   Thyroid Function Tests: No results for input(s): TSH, T4TOTAL, FREET4, T3FREE, THYROIDAB in the last 72 hours. Anemia Panel: Recent Labs    06/03/19 2220 06/04/19 0438  FERRITIN 2,789* 2,746*   Sepsis Labs: Recent Labs  Lab 06/03/19 2220 06/03/19 2359  PROCALCITON 2.17  --   LATICACIDVEN 2.0* 1.8    Recent Results (from the past 240 hour(s))  MRSA PCR Screening     Status: None   Collection Time: 06/03/19  8:43 PM    Specimen: Nasopharyngeal  Result Value Ref Range Status   MRSA by PCR NEGATIVE NEGATIVE Final    Comment:        The GeneXpert MRSA Assay (FDA approved for NASAL specimens only), is one component of a comprehensive MRSA colonization surveillance program. It is not intended to diagnose MRSA infection nor to guide or monitor treatment for MRSA infections. Performed at [facility], [address] [place], [place] [address]   SARS Coronavirus 2 [facility](Hospital order, Performed in [facility] hospital lab) Nasopharyngeal Nasopharyngeal Swab     Status: None   Collection Time: 06/03/19  9:29 PM   Specimen: Nasopharyngeal Swab  Result Value Ref Range Status   SARS Coronavirus 2 NEGATIVE NEGATIVE Final  Comment: (NOTE) If result is NEGATIVE SARS-CoV-2 target nucleic acids are NOT DETECTED. The SARS-CoV-2 RNA is generally detectable in upper and lower  respiratory specimens during the acute phase of infection. The lowest  concentration of SARS-CoV-2 viral copies this assay can detect is 250  copies / mL. A negative result does not preclude SARS-CoV-2 infection  and should not be used as the sole basis for treatment or other  patient management decisions.  A negative result may occur with  improper specimen collection / handling, submission of specimen other  than nasopharyngeal swab, presence of viral mutation(s) within the  areas targeted by this assay, and inadequate number of viral copies  (<250 copies / mL). A negative result must be combined with clinical  observations, patient history, and epidemiological information. If result is POSITIVE SARS-CoV-2 target nucleic acids are DETECTED. The SARS-CoV-2 RNA is generally detectable in upper and lower  respiratory specimens dur ing the acute phase of infection.  Positive  results are indicative of active infection with SARS-CoV-2.  Clinical  correlation with patient history and other diagnostic information is  necessary to  determine patient infection status.  Positive results do  not rule out bacterial infection or co-infection with other viruses. If result is PRESUMPTIVE POSTIVE SARS-CoV-2 nucleic acids MAY BE PRESENT.   A presumptive positive result was obtained on the submitted specimen  and confirmed on repeat testing.  While 2019 novel coronavirus  (SARS-CoV-2) nucleic acids may be present in the submitted sample  additional confirmatory testing may be necessary for epidemiological  and / or clinical management purposes  to differentiate between  SARS-CoV-2 and other Sarbecovirus currently known to infect humans.  If clinically indicated additional testing with an alternate test  methodology [phone]) is advised. The SARS-CoV-2 RNA is generally  detectable in upper and lower respiratory sp ecimens during the acute  phase of infection. The expected result is Negative. Fact Sheet for Patients:  StrictlyIdeas.no Fact Sheet for Healthcare Providers: BankingDealers.co.za This test is not yet approved or cleared by the [place] FDA and has been authorized for detection and/or diagnosis of SARS-CoV-2 by FDA under an Emergency Use Authorization (EUA).  This EUA will remain in effect (meaning this test can be used) for the duration of the COVID-19 declaration under Section 564(b)(1) of the Act, 21 U.S.C. section 360bbb-3(b)(1), unless the authorization is terminated or revoked sooner. Performed at [facility], [place] [address], [place], [place] [address]   Respiratory Panel by PCR     Status: None   Collection Time: 06/03/19  9:29 PM   Specimen: Nasopharyngeal Swab; Respiratory  Result Value Ref Range Status   Adenovirus NOT DETECTED NOT DETECTED Final   Coronavirus 229E NOT DETECTED NOT DETECTED Final    Comment: (NOTE) The Coronavirus on the Respiratory Panel, DOES NOT test for the novel  Coronavirus (2019 nCoV)    Coronavirus HKU1 NOT  DETECTED NOT DETECTED Final   Coronavirus NL63 NOT DETECTED NOT DETECTED Final   Coronavirus OC43 NOT DETECTED NOT DETECTED Final   Metapneumovirus NOT DETECTED NOT DETECTED Final   Rhinovirus / Enterovirus NOT DETECTED NOT DETECTED Final   Influenza A NOT DETECTED NOT DETECTED Final   Influenza B NOT DETECTED NOT DETECTED Final   Parainfluenza Virus 1 NOT DETECTED NOT DETECTED Final   Parainfluenza Virus 2 NOT DETECTED NOT DETECTED Final   Parainfluenza Virus 3 NOT DETECTED NOT DETECTED Final   Parainfluenza Virus 4 NOT DETECTED NOT DETECTED Final   Respiratory Syncytial  Virus NOT DETECTED NOT DETECTED Final   Bordetella pertussis NOT DETECTED NOT DETECTED Final   Chlamydophila pneumoniae NOT DETECTED NOT DETECTED Final   Mycoplasma pneumoniae NOT DETECTED NOT DETECTED Final    Comment: Performed at [facility], [address] [place], [place] [address]  Culture, blood (Routine X 2) w Reflex to ID Panel     Status: None (Preliminary result)   Collection Time: 06/03/19 10:02 PM   Specimen: BLOOD  Result Value Ref Range Status   Specimen Description BLOOD RIGHT ARM  Final   Special Requests   Final    BOTTLES DRAWN AEROBIC ONLY Blood Culture adequate volume   Culture   Final    NO GROWTH < 12 HOURS Performed at [facility], [address] [place], [place] [address]    Report Status PENDING  Incomplete  Culture, blood (Routine X 2) w Reflex to ID Panel     Status: None (Preliminary result)   Collection Time: 06/03/19 10:14 PM   Specimen: BLOOD  Result Value Ref Range Status   Specimen Description BLOOD RIGHT HAND  Final   Special Requests   Final    BOTTLES DRAWN AEROBIC ONLY Blood Culture adequate volume   Culture   Final    NO GROWTH < 12 HOURS Performed at [facility], [address] [place], [place] [address]    Report Status PENDING  Incomplete  Culture, sputum-assessment     Status: None   Collection Time: 06/04/19  6:47 AM   Specimen: Sputum   Result Value Ref Range Status   Specimen Description SPUTUM  Final   Special Requests Normal  Final   Sputum evaluation   Final    THIS SPECIMEN IS ACCEPTABLE FOR SPUTUM CULTURE Performed at [facility], [address] [place], [place] [address]    Report Status 06/04/2019 FINAL  Final  Culture, respiratory     Status: None (Preliminary result)   Collection Time: 06/04/19  6:47 AM   Specimen: SPU  Result Value Ref Range Status   Specimen Description SPUTUM  Final   Special Requests Normal Reflexed from [id number]  Final   Gram Stain   Final    FEW WBC PRESENT,BOTH PMN AND MONONUCLEAR RARE GRAM POSITIVE RODS Performed at [facility], [address] [place], [place] [address]    Culture PENDING  Incomplete   Report Status PENDING  Incomplete      Radiology Studies: Dg Chest Port 1 View  Result Date: 06/04/2019 CLINICAL DATA:  Acute respiratory failure. EXAM: PORTABLE CHEST 1 VIEW COMPARISON:  Radiograph 06/02/2019 at [facility]. FINDINGS: Worsening airspace consolidation in the left mid and upper lung zone. Right lung is clear. Unchanged heart size and mediastinal contours. No large pleural effusion or pneumothorax. No acute osseous abnormalities. IMPRESSION: Worsening airspace consolidation in the left mid and upper lung zone consistent with pneumonia. Recommend radiographic follow-up to resolution. Electronically Signed   By: Narda RutherfordMelanie  Sanford M.D.   On: 06/04/2019 03:16   [place] Abdomen Limited Ruq  Result Date: 06/04/2019 CLINICAL DATA:  Transaminitis EXAM: ULTRASOUND ABDOMEN LIMITED RIGHT UPPER QUADRANT COMPARISON:  None. FINDINGS: Gallbladder: No gallstones or wall thickening visualized. No sonographic Murphy sign noted by sonographer. Common bile duct: Diameter: Normal caliber, 4 mm Liver: Heterogeneous, increased echotexture compatible with fatty infiltration. No focal abnormality or biliary ductal dilatation. Portal vein is patent on color Doppler imaging with  normal direction of blood flow towards the liver.  Other: None. IMPRESSION: Fatty liver.  No acute findings. Electronically Signed   By: Charlett NoseKevin  Dover M.D.   On: 06/04/2019 08:14      Scheduled Meds: . doxycycline  100 mg Oral Q12H  . enoxaparin (LOVENOX) injection  55 mg Subcutaneous Q24H  . folic acid  1 mg Oral Daily  . LORazepam  0-4 mg Intravenous Q6H   Followed by  . [START ON 06/05/2019] LORazepam  0-4 mg Intravenous Q12H  . mouth rinse  15 mL Mouth Rinse BID  . methylPREDNISolone (SOLU-MEDROL) injection  60 mg Intravenous Q12H  . multivitamin with minerals  1 tablet Oral Daily  . sodium chloride flush  3 mL Intravenous Q12H  . thiamine  100 mg Oral Daily   Or  . thiamine  100 mg Intravenous Daily  . vitamin C  500 mg Oral Daily  . zinc sulfate  220 mg Oral Daily   Continuous Infusions: . cefTRIAXone (ROCEPHIN)  IV Stopped (06/04/19 0006)     LOS: 1 day      Time spent: 40 minutes   Noralee StainJennifer Lashonna Rieke, DO [facility] [email or web address] 06/04/2019, 1:49 PM

## 2019-06-05 LAB — COMPREHENSIVE METABOLIC PANEL
ALT: 78 U/L — ABNORMAL HIGH (ref 0–44)
AST: 70 U/L — ABNORMAL HIGH (ref 15–41)
Albumin: 2.8 g/dL — ABNORMAL LOW (ref 3.5–5.0)
Alkaline Phosphatase: 48 U/L (ref 38–126)
Anion gap: 10 (ref 5–15)
BUN: 31 mg/dL — ABNORMAL HIGH (ref 6–20)
CO2: 24 mmol/L (ref 22–32)
Calcium: 8.3 mg/dL — ABNORMAL LOW (ref 8.9–10.3)
Chloride: 100 mmol/L (ref 98–111)
Creatinine, Ser: 1.33 mg/dL — ABNORMAL HIGH (ref 0.61–1.24)
GFR calc Af Amer: >60 mL/min (ref >60)
GFR calc non Af Amer: 59 mL/min — ABNORMAL LOW (ref >60)
Glucose, Bld: 233 mg/dL — ABNORMAL HIGH (ref 70–99)
Potassium: 4 mmol/L (ref 3.5–5.1)
Sodium: 134 mmol/L — ABNORMAL LOW (ref 135–145)
Total Bilirubin: 1.1 mg/dL (ref 0.3–1.2)
Total Protein: 6.7 g/dL (ref 6.5–8.1)

## 2019-06-05 LAB — MAGNESIUM: Magnesium: 2.5 mg/dL — ABNORMAL HIGH (ref 1.7–2.4)

## 2019-06-05 LAB — HEPATITIS PANEL, ACUTE
HCV Ab: <0.1 s/co ratio (ref 0.0–0.9)
Hep A IgM: NEGATIVE
Hep B C IgM: NEGATIVE
Hepatitis B Surface Ag: NEGATIVE

## 2019-06-05 LAB — CBC WITH DIFFERENTIAL/PLATELET
Abs Immature Granulocytes: 0.09 10*3/uL — ABNORMAL HIGH (ref 0.00–0.07)
Basophils Absolute: 0 10*3/uL (ref 0.0–0.1)
Basophils Relative: 0 %
Eosinophils Absolute: 0 10*3/uL (ref 0.0–0.5)
Eosinophils Relative: 0 %
HCT: 42.6 % (ref 39.0–52.0)
Hemoglobin: 14.8 g/dL (ref 13.0–17.0)
Immature Granulocytes: 1 %
Lymphocytes Relative: 12 %
Lymphs Abs: 1 10*3/uL (ref 0.7–4.0)
MCH: 31.4 pg (ref 26.0–34.0)
MCHC: 34.7 g/dL (ref 30.0–36.0)
MCV: 90.3 fL (ref 80.0–100.0)
Monocytes Absolute: 0.6 10*3/uL (ref 0.1–1.0)
Monocytes Relative: 7 %
Neutro Abs: 7.1 10*3/uL (ref 1.7–7.7)
Neutrophils Relative %: 80 %
Platelets: 293 10*3/uL (ref 150–400)
RBC: 4.72 MIL/uL (ref 4.22–5.81)
RDW: 12.4 % (ref 11.5–15.5)
WBC: 8.8 10*3/uL (ref 4.0–10.5)
nRBC: 0 % (ref 0.0–0.2)

## 2019-06-05 LAB — INTERLEUKIN-6, PLASMA
Interleukin-6, Plasma: 25.4 pg/mL — ABNORMAL HIGH (ref 0.0–12.2)
Interleukin-6, Plasma: 17.8 pg/mL — ABNORMAL HIGH (ref 0.0–12.2)

## 2019-06-05 LAB — PHOSPHORUS: Phosphorus: 2.9 mg/dL (ref 2.5–4.6)

## 2019-06-05 LAB — EHRLICHIA ANTIBODY PANEL
E chaffeensis (HGE) Ab, IgG: NEGATIVE
E chaffeensis (HGE) Ab, IgM: NEGATIVE
E. Chaffeensis (HME) IgM Titer: NEGATIVE
E.Chaffeensis (HME) IgG: NEGATIVE

## 2019-06-05 LAB — D-DIMER, QUANTITATIVE: D-Dimer, Quant: 1.99 ug/mL-FEU — ABNORMAL HIGH (ref 0.00–0.50)

## 2019-06-05 LAB — C-REACTIVE PROTEIN: CRP: 13.4 mg/dL — ABNORMAL HIGH (ref <1.0)

## 2019-06-05 LAB — HEPATITIS B SURFACE ANTIGEN: Hepatitis B Surface Ag: NEGATIVE

## 2019-06-05 LAB — CK: Total CK: 466 U/L — ABNORMAL HIGH (ref 49–397)

## 2019-06-05 LAB — FERRITIN: Ferritin: 2871 ng/mL — ABNORMAL HIGH (ref 24–336)

## 2019-06-05 NOTE — Progress Notes (Signed)
PROGRESS NOTE    Kenneth Mejia  VHQ:[id number] DOB: Mar 18, 1961 DOA: 06/03/2019 PCP: Patient, No Pcp Per   Brief Narrative: Rajan Burgard is a 58 y.o. male who transferred to a skilled hospital from [place].  He has past medical history significant for HLD, tobacco abuse.  He initially presented with flulike symptoms to [facility].  He had an outpatient COVID swab which was done 4 days prior to admission that was negative.  Rapid swab at [facility] also negative, however he did have elevated inflammatory markers which raised the suspicion for COVID-19.   Assessment & Plan:   Active Problems:   Sepsis ([place])   CAP (community acquired pneumonia)   Tick borne fever   Rhabdomyolysis   AKI (acute kidney injury) ([place])   Transaminitis   Sepsis Present on admission. Associated kidney injury and elevated lactic acid. Secondary to pneumonia. Blood cultures negative to date.  Suspicion for COVID-19 Multiple tests were negative, however, concerning presentation secondary to symptoms and inflammatory markers. Patient does have evidence of lobar pneumonia on imaging, however. -Continue precautions -Supportive care  Left lower/mid lobe pneumonia -Continue ceftriaxone and doxycycline -Respiratory culture pending  Acute respiratory failure with hypoxia -Continue to wean to room air as able  AKI No baseline creatinine. Stable.  Elevated CK No muscle aches. Possibly secondary to presumed COVID-19 infection  Alcohol abuse Transaminitis In setting of alcohol use vs presumed COVID-19 infection. RUQ ultrasound significant for fatty liver changes. -Continue CIWA  History of tick bite Per patient, this occurred in June, rather than July. Lyme disease workup started at OSH per chart review. Doubt this is related. Patient started on doxycycline.   DVT prophylaxis: Lovenox Code Status:   Code Status: Full Code Family Communication: None Disposition Plan: Discharge in 24  hours if improved and off of oxygen   Consultants:   None  Procedures:   None  Antimicrobials:  Ceftriaxone  Doxycycline    Subjective: Breathing is improving. Malaise is improving.  Objective: Vitals:   06/04/19 1700 06/04/19 2335 06/05/19 0156 06/05/19 0757  BP: 111/63 114/72  107/75  Pulse: 95 76  79  Resp: 20 20  18   Temp: 98.6 F (37 C) 98.2 F (36.8 C)  97.7 F (36.5 C)  TempSrc: Oral Oral  Oral  SpO2: 95% 94% 92% 95%  Weight:      Height:       No intake or output data in the 24 hours ending 06/05/19 1603 Filed Weights   06/03/19 2023  Weight: 113 kg    Examination:  General exam: Appears calm and comfortable Respiratory system: Clear to auscultation. Respiratory effort normal. Cardiovascular system: S1 & S2 heard, RRR. No murmurs, rubs, gallops or clicks. Gastrointestinal system: Abdomen is nondistended, soft and nontender. No organomegaly or masses felt. Normal bowel sounds heard. Central nervous system: Alert and oriented. No focal neurological deficits. Extremities: No edema. No calf tenderness Skin: No cyanosis. No rashes Psychiatry: Judgement and insight appear normal. Mood & affect appropriate.     Data Reviewed: I have personally reviewed following labs and imaging studies  CBC: Recent Labs  Lab 06/03/19 2220 06/04/19 0438 06/05/19 0605  WBC 10.5 9.4 8.8  NEUTROABS 9.1* 8.2* 7.1  HGB 12.7* 12.3* 14.8  HCT 36.2* 34.5* 42.6  MCV 89.6 88.9 90.3  PLT 192 196 413   Basic Metabolic Panel: Recent Labs  Lab 06/03/19 2220 06/04/19 0438 06/05/19 0605  NA 131* 132* 134*  K 4.5 4.0 4.0  CL 100 102  100  CO2 21* 20* 24  GLUCOSE 169* 178* 233*  BUN 25* 23* 31*  CREATININE 1.40* 1.25* 1.33*  CALCIUM 8.0* 8.0* 8.3*  MG  --  2.2 2.5*  PHOS  --  2.5 2.9   GFR: Estimated Creatinine Clearance: 78.6 mL/min (A) (by C-G formula based on SCr of 1.33 mg/dL (H)). Liver Function Tests: Recent Labs  Lab 06/03/19 2220 06/04/19 0438  06/05/19 0605  AST 91* 79* 70*  ALT 57* 58* 78*  ALKPHOS 38 37* 48  BILITOT 1.4* 1.1 1.1  PROT 6.5 6.1* 6.7  ALBUMIN 2.7* 2.5* 2.8*   No results for input(s): LIPASE, AMYLASE in the last 168 hours. No results for input(s): AMMONIA in the last 168 hours. Coagulation Profile: Recent Labs  Lab 06/03/19 2220  INR 1.1   Cardiac Enzymes: Recent Labs  Lab 06/04/19 0438 06/05/19 0605  CKTOTAL 1,266* 466*   BNP (last 3 results) No results for input(s): PROBNP in the last 8760 hours. HbA1C: No results for input(s): HGBA1C in the last 72 hours. CBG: No results for input(s): GLUCAP in the last 168 hours. Lipid Profile: Recent Labs    06/03/19 2220 06/04/19 0438  CHOL  --  148  HDL  --  13*  LDLCALC  --  84  TRIG 213* 257*  CHOLHDL  --  11.4   Thyroid Function Tests: No results for input(s): TSH, T4TOTAL, FREET4, T3FREE, THYROIDAB in the last 72 hours. Anemia Panel: Recent Labs    06/04/19 0438 06/05/19 0605  FERRITIN 2,746* 2,871*   Sepsis Labs: Recent Labs  Lab 06/03/19 2220 06/03/19 2359  PROCALCITON 2.17  --   LATICACIDVEN 2.0* 1.8    Recent Results (from the past 240 hour(s))  MRSA PCR Screening     Status: None   Collection Time: 06/03/19  8:43 PM   Specimen: Nasopharyngeal  Result Value Ref Range Status   MRSA by PCR NEGATIVE NEGATIVE Final    Comment:        The GeneXpert MRSA Assay (FDA approved for NASAL specimens only), is one component of a comprehensive MRSA colonization surveillance program. It is not intended to diagnose MRSA infection nor to guide or monitor treatment for MRSA infections. Performed at [facility], [address] [place], [place] [address]   SARS Coronavirus 2 [facility](Hospital order, Performed in [facility] hospital lab) Nasopharyngeal Nasopharyngeal Swab     Status: None   Collection Time: 06/03/19  9:29 PM   Specimen: Nasopharyngeal Swab  Result Value Ref Range Status   SARS Coronavirus 2 NEGATIVE NEGATIVE Final     Comment: (NOTE) If result is NEGATIVE SARS-CoV-2 target nucleic acids are NOT DETECTED. The SARS-CoV-2 RNA is generally detectable in upper and lower  respiratory specimens during the acute phase of infection. The lowest  concentration of SARS-CoV-2 viral copies this assay can detect is 250  copies / mL. A negative result does not preclude SARS-CoV-2 infection  and should not be used as the sole basis for treatment or other  patient management decisions.  A negative result may occur with  improper specimen collection / handling, submission of specimen other  than nasopharyngeal swab, presence of viral mutation(s) within the  areas targeted by this assay, and inadequate number of viral copies  (<250 copies / mL). A negative result must be combined with clinical  observations, patient history, and epidemiological information. If result is POSITIVE SARS-CoV-2 target nucleic acids are DETECTED. The SARS-CoV-2 RNA is generally detectable in upper and  lower  respiratory specimens dur ing the acute phase of infection.  Positive  results are indicative of active infection with SARS-CoV-2.  Clinical  correlation with patient history and other diagnostic information is  necessary to determine patient infection status.  Positive results do  not rule out bacterial infection or co-infection with other viruses. If result is PRESUMPTIVE POSTIVE SARS-CoV-2 nucleic acids MAY BE PRESENT.   A presumptive positive result was obtained on the submitted specimen  and confirmed on repeat testing.  While 2019 novel coronavirus  (SARS-CoV-2) nucleic acids may be present in the submitted sample  additional confirmatory testing may be necessary for epidemiological  and / or clinical management purposes  to differentiate between  SARS-CoV-2 and other Sarbecovirus currently known to infect humans.  If clinically indicated additional testing with an alternate test  methodology [phone] is advised. The  SARS-CoV-2 RNA is generally  detectable in upper and lower respiratory sp ecimens during the acute  phase of infection. The expected result is Negative. Fact Sheet for Patients:  BoilerBrush.com.cyhttps://www.fda.gov/media/136312/download Fact Sheet for Healthcare Providers: https://pope.com/https://www.fda.gov/media/136313/download This test is not yet approved or cleared by the [place] FDA and has been authorized for detection and/or diagnosis of SARS-CoV-2 by FDA under an Emergency Use Authorization (EUA).  This EUA will remain in effect (meaning this test can be used) for the duration of the COVID-19 declaration under Section 564(b)(1) of the Act, 21 U.S.C. section 360bbb-3(b)(1), unless the authorization is terminated or revoked sooner. Performed at [facility], [address] [place], [place] [address]   Respiratory Panel by PCR     Status: None   Collection Time: 06/03/19  9:29 PM   Specimen: Nasopharyngeal Swab; Respiratory  Result Value Ref Range Status   Adenovirus NOT DETECTED NOT DETECTED Final   Coronavirus 229E NOT DETECTED NOT DETECTED Final    Comment: (NOTE) The Coronavirus on the Respiratory Panel, DOES NOT test for the novel  Coronavirus (2019 nCoV)    Coronavirus HKU1 NOT DETECTED NOT DETECTED Final   Coronavirus NL63 NOT DETECTED NOT DETECTED Final   Coronavirus OC43 NOT DETECTED NOT DETECTED Final   Metapneumovirus NOT DETECTED NOT DETECTED Final   Rhinovirus / Enterovirus NOT DETECTED NOT DETECTED Final   Influenza A NOT DETECTED NOT DETECTED Final   Influenza B NOT DETECTED NOT DETECTED Final   Parainfluenza Virus 1 NOT DETECTED NOT DETECTED Final   Parainfluenza Virus 2 NOT DETECTED NOT DETECTED Final   Parainfluenza Virus 3 NOT DETECTED NOT DETECTED Final   Parainfluenza Virus 4 NOT DETECTED NOT DETECTED Final   Respiratory Syncytial Virus NOT DETECTED NOT DETECTED Final   Bordetella pertussis NOT DETECTED NOT DETECTED Final   Chlamydophila pneumoniae NOT DETECTED NOT  DETECTED Final   Mycoplasma pneumoniae NOT DETECTED NOT DETECTED Final    Comment: Performed at [facility], [address] [place], [place] [address]  Culture, blood (Routine X 2) w Reflex to ID Panel     Status: None (Preliminary result)   Collection Time: 06/03/19 10:02 PM   Specimen: BLOOD  Result Value Ref Range Status   Specimen Description BLOOD RIGHT ARM  Final   Special Requests   Final    BOTTLES DRAWN AEROBIC ONLY Blood Culture adequate volume   Culture   Final    NO GROWTH 2 DAYS Performed at [facility], [address] [place], [place] [address]    Report Status PENDING  Incomplete  Culture, blood (Routine X 2) w  Reflex to ID Panel     Status: None (Preliminary result)   Collection Time: 06/03/19 10:14 PM   Specimen: BLOOD  Result Value Ref Range Status   Specimen Description BLOOD RIGHT HAND  Final   Special Requests   Final    BOTTLES DRAWN AEROBIC ONLY Blood Culture adequate volume   Culture   Final    NO GROWTH 2 DAYS Performed at [facility], [address] [place], [place] [address]    Report Status PENDING  Incomplete  Culture, sputum-assessment     Status: None   Collection Time: 06/04/19  6:47 AM   Specimen: Sputum  Result Value Ref Range Status   Specimen Description SPUTUM  Final   Special Requests Normal  Final   Sputum evaluation   Final    THIS SPECIMEN IS ACCEPTABLE FOR SPUTUM CULTURE Performed at [facility], [address] [place], [place] [address]    Report Status 06/04/2019 FINAL  Final  Culture, respiratory     Status: None (Preliminary result)   Collection Time: 06/04/19  6:47 AM   Specimen: SPU  Result Value Ref Range Status   Specimen Description SPUTUM  Final   Special Requests Normal Reflexed from [id number]  Final   Gram Stain   Final    FEW WBC PRESENT,BOTH PMN AND MONONUCLEAR RARE GRAM POSITIVE RODS    Culture   Final    CULTURE REINCUBATED FOR BETTER GROWTH Performed at [facility],  [address] [place], [place] [address]    Report Status PENDING  Incomplete         Radiology Studies: Dg Chest Port 1 View  Result Date: 06/04/2019 CLINICAL DATA:  Acute respiratory failure. EXAM: PORTABLE CHEST 1 VIEW COMPARISON:  Radiograph 06/02/2019 at [facility]. FINDINGS: Worsening airspace consolidation in the left mid and upper lung zone. Right lung is clear. Unchanged heart size and mediastinal contours. No large pleural effusion or pneumothorax. No acute osseous abnormalities. IMPRESSION: Worsening airspace consolidation in the left mid and upper lung zone consistent with pneumonia. Recommend radiographic follow-up to resolution. Electronically Signed   By: Narda RutherfordMelanie  Sanford M.D.   On: 06/04/2019 03:16   [place] Abdomen Limited Ruq  Result Date: 06/04/2019 CLINICAL DATA:  Transaminitis EXAM: ULTRASOUND ABDOMEN LIMITED RIGHT UPPER QUADRANT COMPARISON:  None. FINDINGS: Gallbladder: No gallstones or wall thickening visualized. No sonographic Murphy sign noted by sonographer. Common bile duct: Diameter: Normal caliber, 4 mm Liver: Heterogeneous, increased echotexture compatible with fatty infiltration. No focal abnormality or biliary ductal dilatation. Portal vein is patent on color Doppler imaging with normal direction of blood flow towards the liver. Other: None. IMPRESSION: Fatty liver.  No acute findings. Electronically Signed   By: Charlett NoseKevin  Dover M.D.   On: 06/04/2019 08:14        Scheduled Meds: . doxycycline  100 mg Oral Q12H  . enoxaparin (LOVENOX) injection  55 mg Subcutaneous Q24H  . folic acid  1 mg Oral Daily  . LORazepam  0-4 mg Intravenous Q6H   Followed by  . LORazepam  0-4 mg Intravenous Q12H  . mouth rinse  15 mL Mouth Rinse BID  . methylPREDNISolone (SOLU-MEDROL) injection  60 mg Intravenous Q12H  . multivitamin with minerals  1 tablet Oral Daily  . sodium chloride flush  3 mL Intravenous Q12H  . thiamine  100 mg Oral Daily   Or  . thiamine  100 mg  Intravenous Daily  . vitamin C  500 mg  Oral Daily  . zinc sulfate  220 mg Oral Daily   Continuous Infusions: . cefTRIAXone (ROCEPHIN)  IV Stopped (06/05/19 0720)     LOS: 2 days     Jacquelin Hawking, MD [facility] 06/05/2019, 4:03 PM  If 7PM-7AM, please contact night-coverage [email or web address]

## 2019-06-06 LAB — ROCKY MTN SPOTTED FVR ABS PNL(IGG+IGM)
RMSF IgG: NEGATIVE
RMSF IgM: 0.36 index (ref 0.00–0.89)

## 2019-06-06 LAB — FERRITIN: Ferritin: 2338 ng/mL — ABNORMAL HIGH (ref 24–336)

## 2019-06-06 LAB — COMPREHENSIVE METABOLIC PANEL
ALT: 99 U/L — ABNORMAL HIGH (ref 0–44)
AST: 70 U/L — ABNORMAL HIGH (ref 15–41)
Albumin: 2.6 g/dL — ABNORMAL LOW (ref 3.5–5.0)
Alkaline Phosphatase: 51 U/L (ref 38–126)
Anion gap: 11 (ref 5–15)
BUN: 29 mg/dL — ABNORMAL HIGH (ref 6–20)
CO2: 23 mmol/L (ref 22–32)
Calcium: 8.4 mg/dL — ABNORMAL LOW (ref 8.9–10.3)
Chloride: 102 mmol/L (ref 98–111)
Creatinine, Ser: 1.11 mg/dL (ref 0.61–1.24)
GFR calc Af Amer: >60 mL/min (ref >60)
GFR calc non Af Amer: >60 mL/min (ref >60)
Glucose, Bld: 268 mg/dL — ABNORMAL HIGH (ref 70–99)
Potassium: 4.4 mmol/L (ref 3.5–5.1)
Sodium: 136 mmol/L (ref 135–145)
Total Bilirubin: 1.2 mg/dL (ref 0.3–1.2)
Total Protein: 6.2 g/dL — ABNORMAL LOW (ref 6.5–8.1)

## 2019-06-06 LAB — CBC WITH DIFFERENTIAL/PLATELET
Abs Immature Granulocytes: 0.17 10*3/uL — ABNORMAL HIGH (ref 0.00–0.07)
Basophils Absolute: 0 10*3/uL (ref 0.0–0.1)
Basophils Relative: 0 %
Eosinophils Absolute: 0 10*3/uL (ref 0.0–0.5)
Eosinophils Relative: 0 %
HCT: 40.4 % (ref 39.0–52.0)
Hemoglobin: 14.2 g/dL (ref 13.0–17.0)
Immature Granulocytes: 2 %
Lymphocytes Relative: 9 %
Lymphs Abs: 0.9 10*3/uL (ref 0.7–4.0)
MCH: 31.6 pg (ref 26.0–34.0)
MCHC: 35.1 g/dL (ref 30.0–36.0)
MCV: 89.8 fL (ref 80.0–100.0)
Monocytes Absolute: 0.6 10*3/uL (ref 0.1–1.0)
Monocytes Relative: 6 %
Neutro Abs: 8.4 10*3/uL — ABNORMAL HIGH (ref 1.7–7.7)
Neutrophils Relative %: 83 %
Platelets: 351 10*3/uL (ref 150–400)
RBC: 4.5 MIL/uL (ref 4.22–5.81)
RDW: 12.2 % (ref 11.5–15.5)
WBC: 10.1 10*3/uL (ref 4.0–10.5)
nRBC: 0 % (ref 0.0–0.2)

## 2019-06-06 LAB — CULTURE, RESPIRATORY W GRAM STAIN: Special Requests: NORMAL

## 2019-06-06 LAB — MAGNESIUM: Magnesium: 2.5 mg/dL — ABNORMAL HIGH (ref 1.7–2.4)

## 2019-06-06 LAB — PHOSPHORUS: Phosphorus: 3.9 mg/dL (ref 2.5–4.6)

## 2019-06-06 LAB — D-DIMER, QUANTITATIVE: D-Dimer, Quant: 1.67 ug/mL-FEU — ABNORMAL HIGH (ref 0.00–0.50)

## 2019-06-06 LAB — INTERLEUKIN-6, PLASMA: Interleukin-6, Plasma: 6.3 pg/mL (ref 0.0–12.2)

## 2019-06-06 LAB — CK: Total CK: 159 U/L (ref 49–397)

## 2019-06-06 LAB — C-REACTIVE PROTEIN: CRP: 8 mg/dL — ABNORMAL HIGH (ref <1.0)

## 2019-06-06 MED ORDER — DOXYCYCLINE HYCLATE 50 MG PO CAPS: 50.0000 mg | ORAL_CAPSULE | Freq: Two times a day (BID) | ORAL | 0 refills | Status: AC

## 2019-06-06 MED ORDER — CEFDINIR 300 MG PO CAPS: 300.0000 mg | ORAL_CAPSULE | Freq: Two times a day (BID) | ORAL | 0 refills | Status: AC

## 2019-06-06 MED ORDER — PREDNISONE 10 MG PO TABS: ORAL_TABLET | ORAL | 0 refills | Status: AC

## 2019-06-06 NOTE — Care Management (Signed)
CM was unable to reach pt via phone. Per pts wife he was independent prior to admit.  Wife will transport pt home.  Wife confirms that pt has thermometer in the home.  No CM needs - CM signing off

## 2019-06-06 NOTE — Discharge Instructions (Signed)
Kenneth Mejia,  You were in the hospital with a respiratory illness requiring oxygen. You have been doing better. It appears that you likely have a bacterial pneumonia but there was mild concern for you having a COVID-19 infection. I have included antibiotics and steroids for you to complete. Please also review the COVID-19 documentation with regard to quarantining.

## 2019-06-06 NOTE — Discharge Summary (Signed)
Physician Discharge Summary  Ayce Pietrzyk ZOX:[id number] DOB: May 01, 1961 DOA: 06/03/2019  PCP: Patient, No Pcp Per  Admit date: 06/03/2019 Discharge date: 06/06/2019  Admitted From: Home Disposition: Home  Recommendations for Outpatient Follow-up:  1. Follow up with PCP in 1 week 2. Please obtain CMP/CBC in one week 3. Please follow up on the following pending results: None  Home Health: None Equipment/Devices: None  Discharge Condition: Stable CODE STATUS: Full code Diet recommendation: Heart healthy   Brief/Interim Summary:  Admission HPI written by Therisa Doyne, MD   HPI: Jacorian Golaszewski is a 58 y.o. male with medical history significant of HLD, tobacco abuse    Presented with   with  flu-like symptoms and febrile with a T-max of 106 to [place]  prior  patient had outpatient COVID swab done 4 days prior to admission that was negative.  Rapid swab done  At [facility] also negative however blood work very suspicious  ferritin elevated at 1150, LDH elevated at 993, total creatinine kinase 2472, C-reactive protein greater than 270 with left-sided pneumonia seen on imaging.  Given worsening inflammatory  markers and febrile state transfered to [facility] for further care.   Continues to have a cough  Got sick on 8/12, he works as a Furniture conservator/restorer,  Not much exposure to people family been healthy His coworker had a ride with him  Brief narrative/HPI from [place]  Patient    presented to the ER after worsening of flu-like symptoms ongoing for 4 days.  Symptoms include loss of taste, fevers, body aches with nausea and vomiting.  Patient had outpatient COVID testing performed 24 hours after symptom onset with test results pending.  Fever in triage was 103.4.  Patient was not hypoxic at rest although while febrile was quite diaphoretic.  Chest x-ray revealed left middle lobe pneumonia.  Multiple inflammatory markers were elevated, CPK elevated.  COVID-19 rapid  screen was negative.  Patient has been started on Zithromax and Rocephin for community-acquired pneumonia.  Of note white count is normal with left shift as well as lymphopenia.  O2 sats on room air 93%.  In addition to mentioned lab abnormalities patient had hyponatremia and acute kidney injury with a creatinine of 1.3.   Hospital course:  Sepsis Present on admission. Associated kidney injury and elevated lactic acid. Secondary to pneumonia. Blood cultures negative to date. Management with antibiotics.  Suspicion for COVID-19 Multiple tests were negative, however, concerning presentation secondary to symptoms and inflammatory markers. Patient does have evidence of lobar pneumonia on imaging, however and is responding to antibiotics. CK, ferritin, CRP, d-dimer trended down. ALT slightly elevated.  Left lower/mid lobe pneumonia Patient empirically treated with ceftriaxone and doxycycline. Transitioned to Cefdinir and doxycycline on discharge. Culture significant for mild candida with normal flora.  Acute respiratory failure with hypoxia Secondary to pneumonia. Resolved prior to discharge.  AKI No baseline creatinine. Stable.  Elevated CK No muscle aches. Possibly secondary to presumed COVID-19 infection  Alcohol abuse Transaminitis In setting of alcohol use vs presumed COVID-19 infection. RUQ ultrasound significant for fatty liver changes. CIWA while inpatient. Discussed with patient.  History of tick bite Per patient, this occurred in June, rather than July. Lyme disease workup started at OSH per chart review. Doubt this is related. Patient started on doxycycline but low suspicion. Follow-up Lyme titers.  Discharge Diagnoses:  Active Problems:   Sepsis (HCC)   CAP (community acquired pneumonia)   Tick borne fever   Rhabdomyolysis   AKI (  acute kidney injury) (HCC)   Transaminitis    Discharge Instructions   Allergies as of 06/06/2019   No Known Allergies      Medication List    TAKE these medications   cefdinir 300 MG capsule Commonly known as: OMNICEF Take 1 capsule (300 mg total) by mouth 2 (two) times daily for 4 days.   doxycycline 50 MG capsule Commonly known as: VIBRAMYCIN Take 1 capsule (50 mg total) by mouth 2 (two) times daily for 4 days.   multivitamin with minerals Tabs tablet Take 1 tablet by mouth daily.   predniSONE 10 MG tablet Commonly known as: DELTASONE Take 4 tablets (40 mg total) by mouth daily for 2 days, THEN 3 tablets (30 mg total) daily for 2 days, THEN 2 tablets (20 mg total) daily for 2 days, THEN 1 tablet (10 mg total) daily for 2 days. Start taking on: June 07, 2019       No Known Allergies  Consultations:  None   Procedures/Studies: Dg Chest Port 1 View  Result Date: 06/04/2019 CLINICAL DATA:  Acute respiratory failure. EXAM: PORTABLE CHEST 1 VIEW COMPARISON:  Radiograph 06/02/2019 at [facility]. FINDINGS: Worsening airspace consolidation in the left mid and upper lung zone. Right lung is clear. Unchanged heart size and mediastinal contours. No large pleural effusion or pneumothorax. No acute osseous abnormalities. IMPRESSION: Worsening airspace consolidation in the left mid and upper lung zone consistent with pneumonia. Recommend radiographic follow-up to resolution. Electronically Signed   By: Narda Rutherford M.D.   On: 06/04/2019 03:16   US Abdomen Limited Ruq  Result Date: 06/04/2019 CLINICAL DATA:  Transaminitis EXAM: ULTRASOUND ABDOMEN LIMITED RIGHT UPPER QUADRANT COMPARISON:  None. FINDINGS: Gallbladder: No gallstones or wall thickening visualized. No sonographic Murphy sign noted by sonographer. Common bile duct: Diameter: Normal caliber, 4 mm Liver: Heterogeneous, increased echotexture compatible with fatty infiltration. No focal abnormality or biliary ductal dilatation. Portal vein is patent on color Doppler imaging with normal direction of blood flow towards the liver. Other: None.  IMPRESSION: Fatty liver.  No acute findings. Electronically Signed   By: Charlett Nose M.D.   On: 06/04/2019 08:14      Subjective: Feeling better today. Dyspnea improved.  Discharge Exam: Vitals:   06/05/19 2301 06/06/19 0800  BP: 125/81 123/81  Pulse: 81 85  Resp: 18 20  Temp: 97.7 F (36.5 C) 97.9 F (36.6 C)  SpO2: 94% 92%   Vitals:   06/05/19 0757 06/05/19 1751 06/05/19 2301 06/06/19 0800  BP: 107/75 110/80 125/81 123/81  Pulse: 79 82 81 85  Resp: 18 17 18 20   Temp: 97.7 F (36.5 C)  97.7 F (36.5 C) 97.9 F (36.6 C)  TempSrc: Oral  Oral Oral  SpO2: 95% 95% 94% 92%  Weight:      Height:        General: Pt is alert, awake, not in acute distress Cardiovascular: RRR, S1/S2 +, no rubs, no gallops Respiratory: CTA bilaterally, no wheezing, no rhonchi Abdominal: Soft, NT, ND, bowel sounds + Extremities: no edema, no cyanosis    The results of significant diagnostics from this hospitalization (including imaging, microbiology, ancillary and laboratory) are listed below for reference.     Microbiology: Recent Results (from the past 240 hour(s))  MRSA PCR Screening     Status: None   Collection Time: 06/03/19  8:43 PM   Specimen: Nasopharyngeal  Result Value Ref Range Status   MRSA by PCR NEGATIVE NEGATIVE Final    Comment:  The GeneXpert MRSA Assay (FDA approved for NASAL specimens only), is one component of a comprehensive MRSA colonization surveillance program. It is not intended to diagnose MRSA infection nor to guide or monitor treatment for MRSA infections. Performed at [facility], [address] [place], [place] [address]   SARS Coronavirus 2 [facility](Hospital order, Performed in [facility] hospital lab) Nasopharyngeal Nasopharyngeal Swab     Status: None   Collection Time: 06/03/19  9:29 PM   Specimen: Nasopharyngeal Swab  Result Value Ref Range Status   SARS Coronavirus 2 NEGATIVE NEGATIVE Final    Comment: (NOTE) If result is NEGATIVE  SARS-CoV-2 target nucleic acids are NOT DETECTED. The SARS-CoV-2 RNA is generally detectable in upper and lower  respiratory specimens during the acute phase of infection. The lowest  concentration of SARS-CoV-2 viral copies this assay can detect is 250  copies / mL. A negative result does not preclude SARS-CoV-2 infection  and should not be used as the sole basis for treatment or other  patient management decisions.  A negative result may occur with  improper specimen collection / handling, submission of specimen other  than nasopharyngeal swab, presence of viral mutation(s) within the  areas targeted by this assay, and inadequate number of viral copies  (<250 copies / mL). A negative result must be combined with clinical  observations, patient history, and epidemiological information. If result is POSITIVE SARS-CoV-2 target nucleic acids are DETECTED. The SARS-CoV-2 RNA is generally detectable in upper and lower  respiratory specimens dur ing the acute phase of infection.  Positive  results are indicative of active infection with SARS-CoV-2.  Clinical  correlation with patient history and other diagnostic information is  necessary to determine patient infection status.  Positive results do  not rule out bacterial infection or co-infection with other viruses. If result is PRESUMPTIVE POSTIVE SARS-CoV-2 nucleic acids MAY BE PRESENT.   A presumptive positive result was obtained on the submitted specimen  and confirmed on repeat testing.  While 2019 novel coronavirus  (SARS-CoV-2) nucleic acids may be present in the submitted sample  additional confirmatory testing may be necessary for epidemiological  and / or clinical management purposes  to differentiate between  SARS-CoV-2 and other Sarbecovirus currently known to infect humans.  If clinically indicated additional testing with an alternate test  methodology [phone] is advised. The SARS-CoV-2 RNA is generally  detectable in upper  and lower respiratory sp ecimens during the acute  phase of infection. The expected result is Negative. Fact Sheet for Patients:  BoilerBrush.com.cyhttps://www.fda.gov/media/136312/download Fact Sheet for Healthcare Providers: https://pope.com/https://www.fda.gov/media/136313/download This test is not yet approved or cleared by the [place] FDA and has been authorized for detection and/or diagnosis of SARS-CoV-2 by FDA under an Emergency Use Authorization (EUA).  This EUA will remain in effect (meaning this test can be used) for the duration of the COVID-19 declaration under Section 564(b)(1) of the Act, 21 U.S.C. section 360bbb-3(b)(1), unless the authorization is terminated or revoked sooner. Performed at [facility], [address] [place], [place] [address]   Respiratory Panel by PCR     Status: None   Collection Time: 06/03/19  9:29 PM   Specimen: Nasopharyngeal Swab; Respiratory  Result Value Ref Range Status   Adenovirus NOT DETECTED NOT DETECTED Final   Coronavirus 229E NOT DETECTED NOT DETECTED Final    Comment: (NOTE) The Coronavirus on the Respiratory Panel, DOES NOT test for the novel  Coronavirus (2019 nCoV)    Coronavirus HKU1 NOT  DETECTED NOT DETECTED Final   Coronavirus NL63 NOT DETECTED NOT DETECTED Final   Coronavirus OC43 NOT DETECTED NOT DETECTED Final   Metapneumovirus NOT DETECTED NOT DETECTED Final   Rhinovirus / Enterovirus NOT DETECTED NOT DETECTED Final   Influenza A NOT DETECTED NOT DETECTED Final   Influenza B NOT DETECTED NOT DETECTED Final   Parainfluenza Virus 1 NOT DETECTED NOT DETECTED Final   Parainfluenza Virus 2 NOT DETECTED NOT DETECTED Final   Parainfluenza Virus 3 NOT DETECTED NOT DETECTED Final   Parainfluenza Virus 4 NOT DETECTED NOT DETECTED Final   Respiratory Syncytial Virus NOT DETECTED NOT DETECTED Final   Bordetella pertussis NOT DETECTED NOT DETECTED Final   Chlamydophila pneumoniae NOT DETECTED NOT DETECTED Final   Mycoplasma pneumoniae NOT DETECTED  NOT DETECTED Final    Comment: Performed at [facility], [address] [place], [place] [address]  Culture, blood (Routine X 2) w Reflex to ID Panel     Status: None (Preliminary result)   Collection Time: 06/03/19 10:02 PM   Specimen: BLOOD  Result Value Ref Range Status   Specimen Description BLOOD RIGHT ARM  Final   Special Requests   Final    BOTTLES DRAWN AEROBIC ONLY Blood Culture adequate volume   Culture   Final    NO GROWTH 3 DAYS Performed at [facility], [address] [place], [place] [address]    Report Status PENDING  Incomplete  Culture, blood (Routine X 2) w Reflex to ID Panel     Status: None (Preliminary result)   Collection Time: 06/03/19 10:14 PM   Specimen: BLOOD  Result Value Ref Range Status   Specimen Description BLOOD RIGHT HAND  Final   Special Requests   Final    BOTTLES DRAWN AEROBIC ONLY Blood Culture adequate volume   Culture   Final    NO GROWTH 3 DAYS Performed at [facility], [address] [place], [place] [address]    Report Status PENDING  Incomplete  Culture, sputum-assessment     Status: None   Collection Time: 06/04/19  6:47 AM   Specimen: Sputum  Result Value Ref Range Status   Specimen Description SPUTUM  Final   Special Requests Normal  Final   Sputum evaluation   Final    THIS SPECIMEN IS ACCEPTABLE FOR SPUTUM CULTURE Performed at [facility], [address] [place], [place] [address]    Report Status 06/04/2019 FINAL  Final  Culture, respiratory     Status: None   Collection Time: 06/04/19  6:47 AM   Specimen: SPU  Result Value Ref Range Status   Specimen Description SPUTUM  Final   Special Requests Normal Reflexed from [id number]  Final   Gram Stain   Final    FEW WBC PRESENT,BOTH PMN AND MONONUCLEAR RARE GRAM POSITIVE RODS    Culture   Final    FEW CANDIDA ALBICANS WITH NORMAL FLORA Performed at [facility], [address] [place], [place] [address]    Report Status 06/06/2019 FINAL   Final     Labs: BNP (last 3 results) Recent Labs    06/03/19 2220  BNP 47.0   Basic Metabolic Panel: Recent Labs  Lab 06/03/19 2220 06/04/19 0438 06/05/19 0605 06/06/19 0451  NA 131* 132* 134* 136  K 4.5 4.0 4.0 4.4  CL 100 102 100 102  CO2 21* 20* 24 23  GLUCOSE 169* 178* 233* 268*  BUN 25* 23*  31* 29*  CREATININE 1.40* 1.25* 1.33* 1.11  CALCIUM 8.0* 8.0* 8.3* 8.4*  MG  --  2.2 2.5* 2.5*  PHOS  --  2.5 2.9 3.9   Liver Function Tests: Recent Labs  Lab 06/03/19 2220 06/04/19 0438 06/05/19 0605 06/06/19 0451  AST 91* 79* 70* 70*  ALT 57* 58* 78* 99*  ALKPHOS 38 37* 48 51  BILITOT 1.4* 1.1 1.1 1.2  PROT 6.5 6.1* 6.7 6.2*  ALBUMIN 2.7* 2.5* 2.8* 2.6*   No results for input(s): LIPASE, AMYLASE in the last 168 hours. No results for input(s): AMMONIA in the last 168 hours. CBC: Recent Labs  Lab 06/03/19 2220 06/04/19 0438 06/05/19 0605 06/06/19 0451  WBC 10.5 9.4 8.8 10.1  NEUTROABS 9.1* 8.2* 7.1 8.4*  HGB 12.7* 12.3* 14.8 14.2  HCT 36.2* 34.5* 42.6 40.4  MCV 89.6 88.9 90.3 89.8  PLT 192 196 293 351   Cardiac Enzymes: Recent Labs  Lab 06/04/19 0438 06/05/19 0605 06/06/19 0451  CKTOTAL 1,266* 466* 159   BNP: Invalid input(s): POCBNP CBG: No results for input(s): GLUCAP in the last 168 hours. D-Dimer Recent Labs    06/05/19 0605 06/06/19 0451  DDIMER 1.99* 1.67*   Hgb A1c No results for input(s): HGBA1C in the last 72 hours. Lipid Profile Recent Labs    06/03/19 2220 06/04/19 0438  CHOL  --  148  HDL  --  13*  LDLCALC  --  84  TRIG 213* 257*  CHOLHDL  --  11.4   Thyroid function studies No results for input(s): TSH, T4TOTAL, T3FREE, THYROIDAB in the last 72 hours.  Invalid input(s): FREET3 Anemia work up [employer]    06/05/19 0605 06/06/19 0451  FERRITIN 2,871* 2,338*   Urinalysis No results found for: COLORURINE, APPEARANCEUR, LABSPEC, PHURINE, GLUCOSEU, HGBUR, BILIRUBINUR, KETONESUR, PROTEINUR, UROBILINOGEN, NITRITE,  LEUKOCYTESUR Sepsis Labs Invalid input(s): PROCALCITONIN,  WBC,  LACTICIDVEN Microbiology Recent Results (from the past 240 hour(s))  MRSA PCR Screening     Status: None   Collection Time: 06/03/19  8:43 PM   Specimen: Nasopharyngeal  Result Value Ref Range Status   MRSA by PCR NEGATIVE NEGATIVE Final    Comment:        The GeneXpert MRSA Assay (FDA approved for NASAL specimens only), is one component of a comprehensive MRSA colonization surveillance program. It is not intended to diagnose MRSA infection nor to guide or monitor treatment for MRSA infections. Performed at [facility], [address], [place], [place] [address]   SARS Coronavirus 2 [facility] order, Performed in [facility] hospital lab) Nasopharyngeal Nasopharyngeal Swab     Status: None   Collection Time: 06/03/19  9:29 PM   Specimen: Nasopharyngeal Swab  Result Value Ref Range Status   SARS Coronavirus 2 NEGATIVE NEGATIVE Final    Comment: (NOTE) If result is NEGATIVE SARS-CoV-2 target nucleic acids are NOT DETECTED. The SARS-CoV-2 RNA is generally detectable in upper and lower  respiratory specimens during the acute phase of infection. The lowest  concentration of SARS-CoV-2 viral copies this assay can detect is 250  copies / mL. A negative result does not preclude SARS-CoV-2 infection  and should not be used as the sole basis for treatment or other  patient management decisions.  A negative result may occur with  improper specimen collection / handling, submission of specimen other  than nasopharyngeal swab, presence of viral mutation(s) within the  areas targeted by this assay, and inadequate number of viral copies  (<250 copies /  mL). A negative result must be combined with clinical  observations, patient history, and epidemiological information. If result is POSITIVE SARS-CoV-2 target nucleic acids are DETECTED. The SARS-CoV-2 RNA is generally detectable in upper and lower  respiratory  specimens dur ing the acute phase of infection.  Positive  results are indicative of active infection with SARS-CoV-2.  Clinical  correlation with patient history and other diagnostic information is  necessary to determine patient infection status.  Positive results do  not rule out bacterial infection or co-infection with other viruses. If result is PRESUMPTIVE POSTIVE SARS-CoV-2 nucleic acids MAY BE PRESENT.   A presumptive positive result was obtained on the submitted specimen  and confirmed on repeat testing.  While 2019 novel coronavirus  (SARS-CoV-2) nucleic acids may be present in the submitted sample  additional confirmatory testing may be necessary for epidemiological  and / or clinical management purposes  to differentiate between  SARS-CoV-2 and other Sarbecovirus currently known to infect humans.  If clinically indicated additional testing with an alternate test  methodology [phone] is advised. The SARS-CoV-2 RNA is generally  detectable in upper and lower respiratory sp ecimens during the acute  phase of infection. The expected result is Negative. Fact Sheet for Patients:  StrictlyIdeas.no Fact Sheet for Healthcare Providers: BankingDealers.co.za This test is not yet approved or cleared by the [place] FDA and has been authorized for detection and/or diagnosis of SARS-CoV-2 by FDA under an Emergency Use Authorization (EUA).  This EUA will remain in effect (meaning this test can be used) for the duration of the COVID-19 declaration under Section 564(b)(1) of the Act, 21 U.S.C. section 360bbb-3(b)(1), unless the authorization is terminated or revoked sooner. Performed at [facility], [place] [address], [place], [place] [address]   Respiratory Panel by PCR     Status: None   Collection Time: 06/03/19  9:29 PM   Specimen: Nasopharyngeal Swab; Respiratory  Result Value Ref Range Status   Adenovirus NOT DETECTED NOT  DETECTED Final   Coronavirus 229E NOT DETECTED NOT DETECTED Final    Comment: (NOTE) The Coronavirus on the Respiratory Panel, DOES NOT test for the novel  Coronavirus (2019 nCoV)    Coronavirus HKU1 NOT DETECTED NOT DETECTED Final   Coronavirus NL63 NOT DETECTED NOT DETECTED Final   Coronavirus OC43 NOT DETECTED NOT DETECTED Final   Metapneumovirus NOT DETECTED NOT DETECTED Final   Rhinovirus / Enterovirus NOT DETECTED NOT DETECTED Final   Influenza A NOT DETECTED NOT DETECTED Final   Influenza B NOT DETECTED NOT DETECTED Final   Parainfluenza Virus 1 NOT DETECTED NOT DETECTED Final   Parainfluenza Virus 2 NOT DETECTED NOT DETECTED Final   Parainfluenza Virus 3 NOT DETECTED NOT DETECTED Final   Parainfluenza Virus 4 NOT DETECTED NOT DETECTED Final   Respiratory Syncytial Virus NOT DETECTED NOT DETECTED Final   Bordetella pertussis NOT DETECTED NOT DETECTED Final   Chlamydophila pneumoniae NOT DETECTED NOT DETECTED Final   Mycoplasma pneumoniae NOT DETECTED NOT DETECTED Final    Comment: Performed at [facility], [place]. [address] [place], [place] [address]  Culture, blood (Routine X 2) w Reflex to ID Panel     Status: None (Preliminary result)   Collection Time: 06/03/19 10:02 PM   Specimen: BLOOD  Result Value Ref Range Status   Specimen Description BLOOD RIGHT ARM  Final   Special Requests   Final    BOTTLES DRAWN AEROBIC ONLY Blood Culture adequate volume   Culture   Final  NO GROWTH 3 DAYS Performed at [facility], [address] [place], [place] [address]    Report Status PENDING  Incomplete  Culture, blood (Routine X 2) w Reflex to ID Panel     Status: None (Preliminary result)   Collection Time: 06/03/19 10:14 PM   Specimen: BLOOD  Result Value Ref Range Status   Specimen Description BLOOD RIGHT HAND  Final   Special Requests   Final    BOTTLES DRAWN AEROBIC ONLY Blood Culture adequate volume   Culture   Final    NO GROWTH 3 DAYS Performed at [facility], [address] [place], [place] [address]    Report Status PENDING  Incomplete  Culture, sputum-assessment     Status: None   Collection Time: 06/04/19  6:47 AM   Specimen: Sputum  Result Value Ref Range Status   Specimen Description SPUTUM  Final   Special Requests Normal  Final   Sputum evaluation   Final    THIS SPECIMEN IS ACCEPTABLE FOR SPUTUM CULTURE Performed at [facility], [address] [place], [place] [address]    Report Status 06/04/2019 FINAL  Final  Culture, respiratory     Status: None   Collection Time: 06/04/19  6:47 AM   Specimen: SPU  Result Value Ref Range Status   Specimen Description SPUTUM  Final   Special Requests Normal Reflexed from [id number]  Final   Gram Stain   Final    FEW WBC PRESENT,BOTH PMN AND MONONUCLEAR RARE GRAM POSITIVE RODS    Culture   Final    FEW CANDIDA ALBICANS WITH NORMAL FLORA Performed at [facility], [address] [place], [place] [address]    Report Status 06/06/2019 FINAL  Final     Time coordinating discharge: Over 35 minutes  SIGNED:   Jacquelin Hawkingalph Kensleigh Gates, MD [facility] 06/06/2019, 2:37 PM

## 2019-06-06 NOTE — Progress Notes (Signed)
Inpatient Diabetes Program Recommendations  AACE/ADA: New Consensus Statement on Inpatient Glycemic Control  Target Ranges:  Prepandial:   less than 140 mg/dL      Peak postprandial:   less than 180 mg/dL (1-2 hours)      Critically ill patients:  140 - 180 mg/dL   Results for Kenneth Mejia, Kenneth Mejia (MRN [id number]) as of 06/06/2019 10:17  Ref. Range 06/03/2019 22:20 06/04/2019 04:38 06/05/2019 06:05 06/06/2019 04:51  Glucose Latest Ref Range: 70 - 99 mg/dL 169 (H) 178 (H) 233 (H) 268 (H)   Review of Glycemic Control  Diabetes history: No Outpatient Diabetes medications: NA Current orders for Inpatient glycemic control: None; Solumedrol 60 mg Q12H  Inpatient Diabetes Program Recommendations: Correction (SSI): While inpatient and ordered steroids, please consider ordering CBGs with Novolog correction scale ACHS.  Thanks, Barnie Alderman, RN, MSN, CDE Diabetes Coordinator Inpatient Diabetes Program [phone] (Team Pager from 8am to 5pm)

## 2019-06-07 LAB — INTERLEUKIN-6, PLASMA: Interleukin-6, Plasma: 6.3 pg/mL (ref 0.0–12.2)

## 2019-06-08 LAB — CULTURE, BLOOD (ROUTINE X 2)
Culture: NO GROWTH
Culture: NO GROWTH
Special Requests: ADEQUATE
Special Requests: ADEQUATE

## 2020-07-03 IMAGING — DX PORTABLE CHEST - 1 VIEW
1 series · 1 of 1 positions shown · non-contrast
Comparison: Radiograph 06/02/2019 at [facility].

CLINICAL DATA: Acute respiratory failure.

EXAM:
PORTABLE CHEST 1 VIEW

[chest]
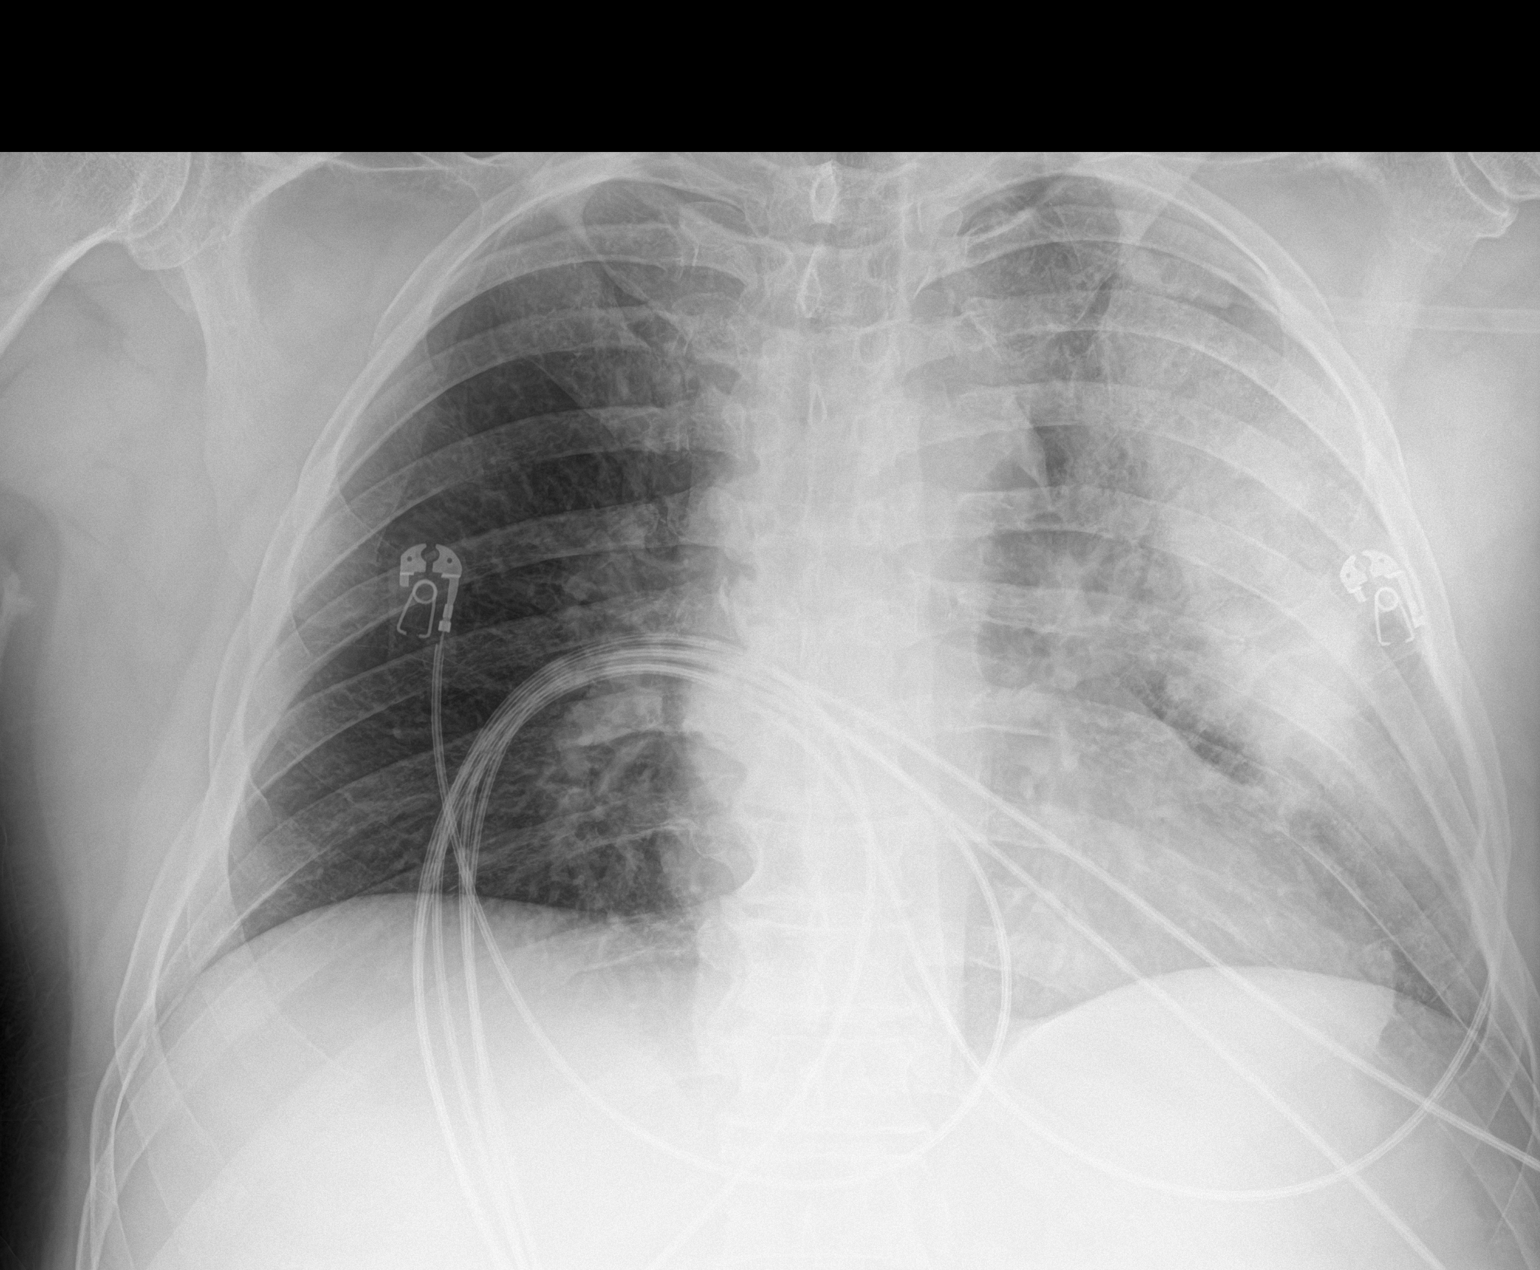

[1 of 1 positions shown; findings below may reference images not displayed]

FINDINGS: Worsening airspace consolidation in the left mid and upper lung
zone. Right lung is clear. Unchanged heart size and mediastinal
contours. No large pleural effusion or pneumothorax. No acute
osseous abnormalities.
IMPRESSION: Worsening airspace consolidation in the left mid and upper lung zone
consistent with pneumonia. Recommend radiographic follow-up to
resolution.

## 2020-07-03 IMAGING — US ULTRASOUND ABDOMEN LIMITED
1 series · 14 of 25 positions shown · non-contrast
Comparison: None.

CLINICAL DATA: Transaminitis

EXAM:
ULTRASOUND ABDOMEN LIMITED RIGHT UPPER QUADRANT

[Series 1: ultrasound abdomen limited · 14 of 39 slices shown]
[im 1/39]
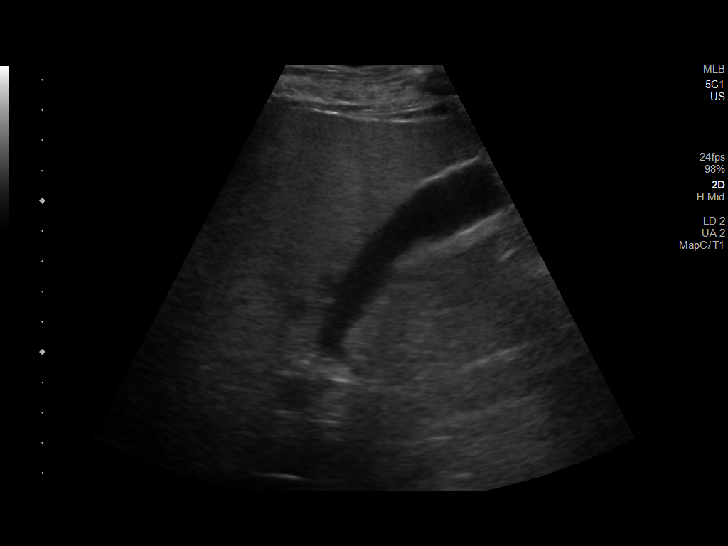
[im 4/39]
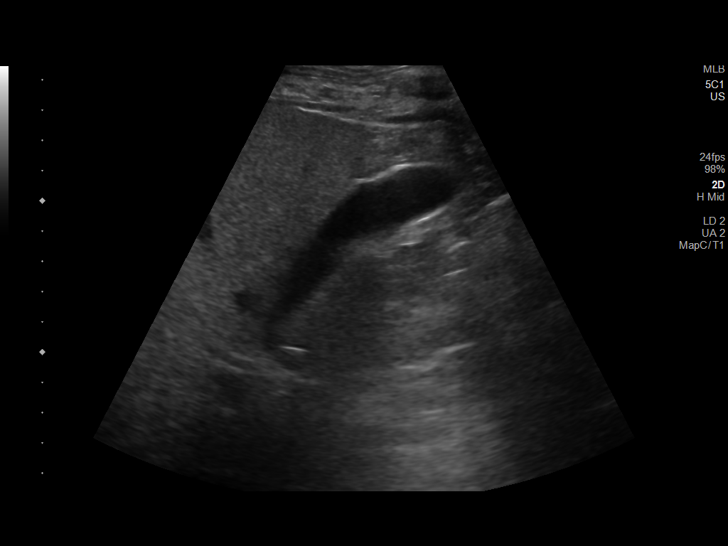
[im 7/39]
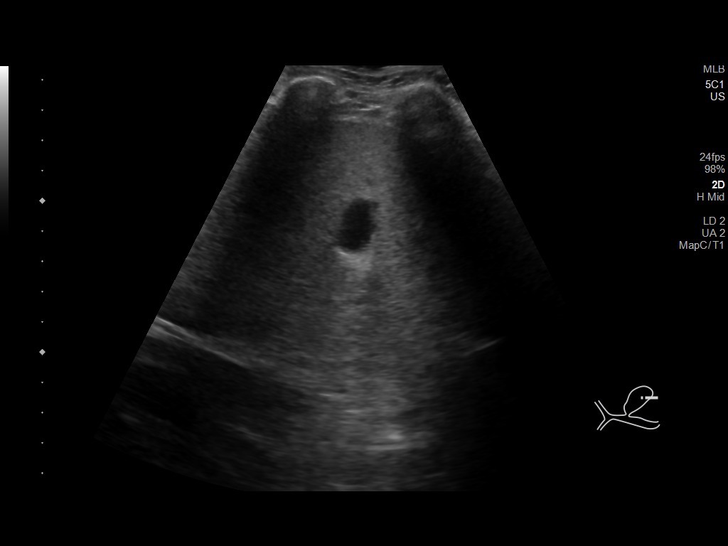
[im 10/39]
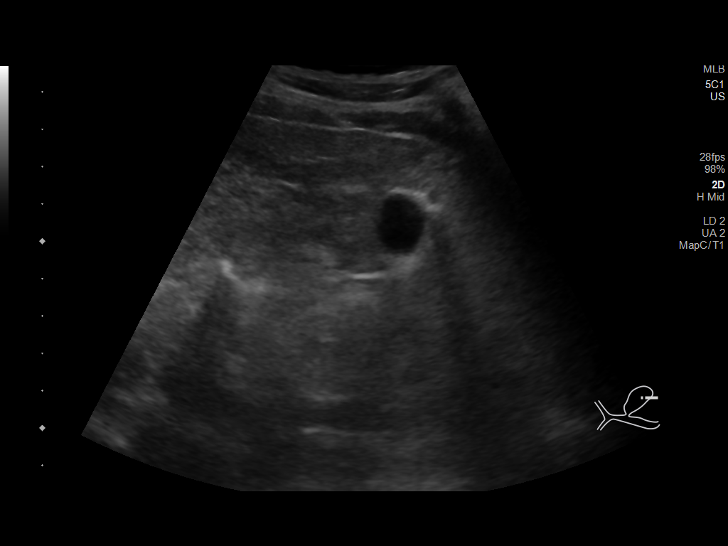
[im 13/39]
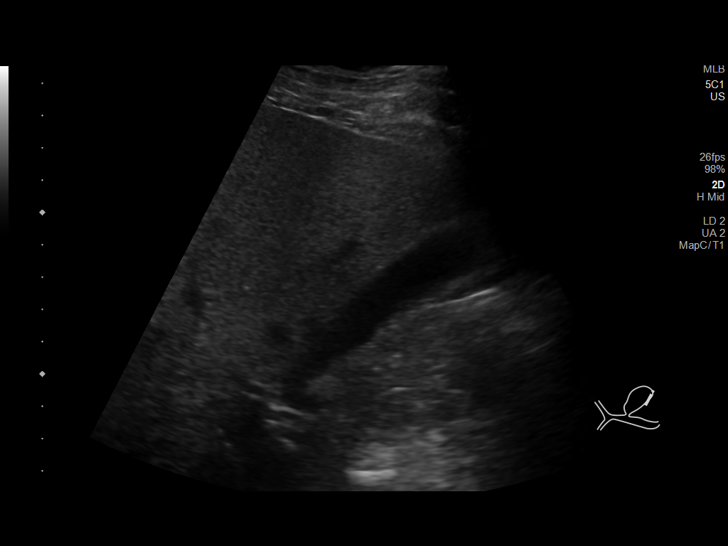
[im 15/39]
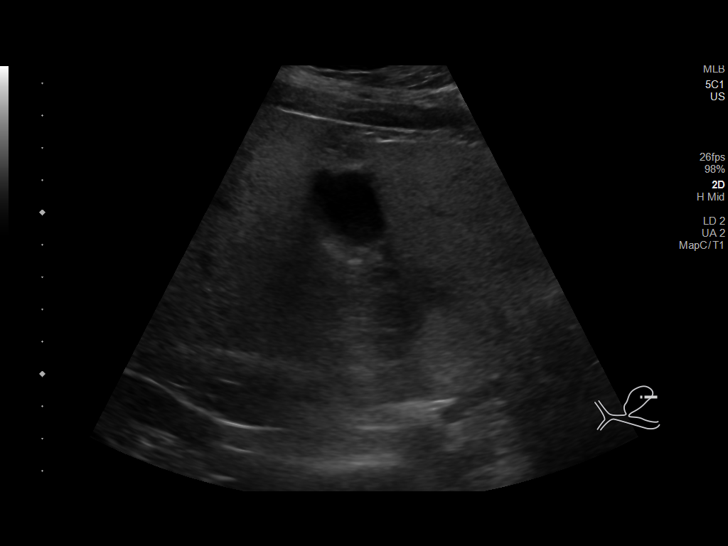
[im 18/39]
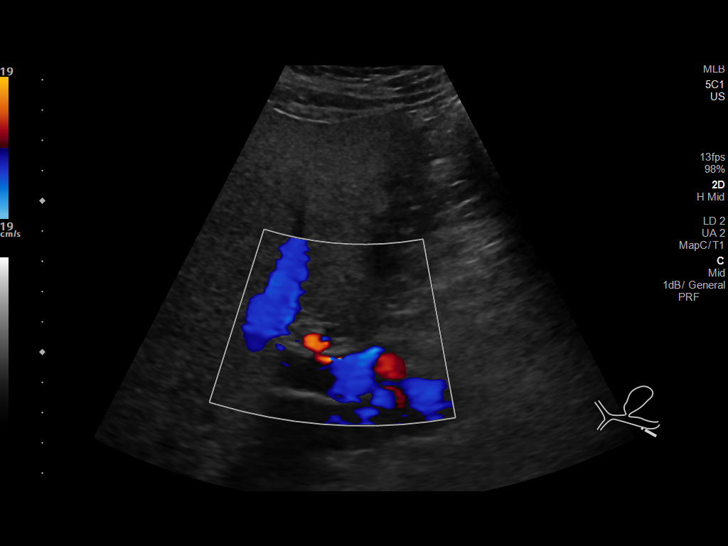
[im 21/39]
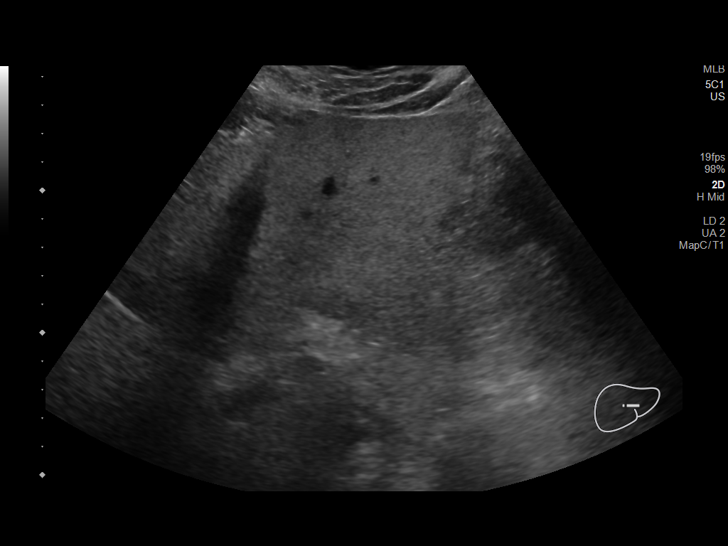
[im 24/39]
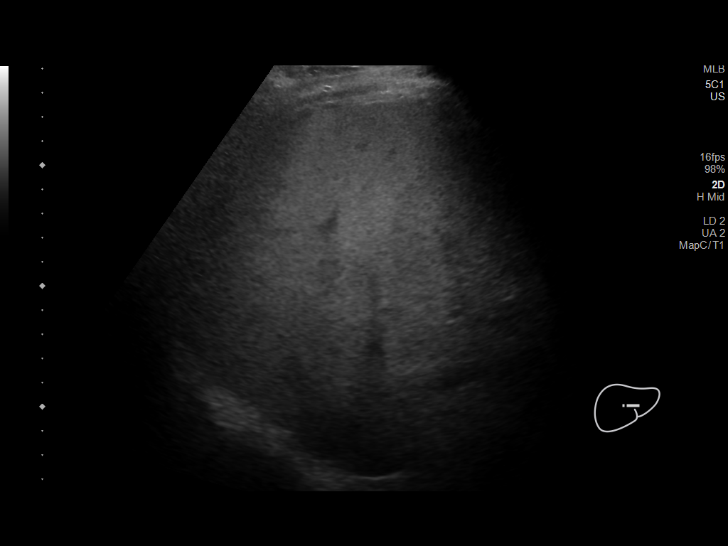
[im 26/39]
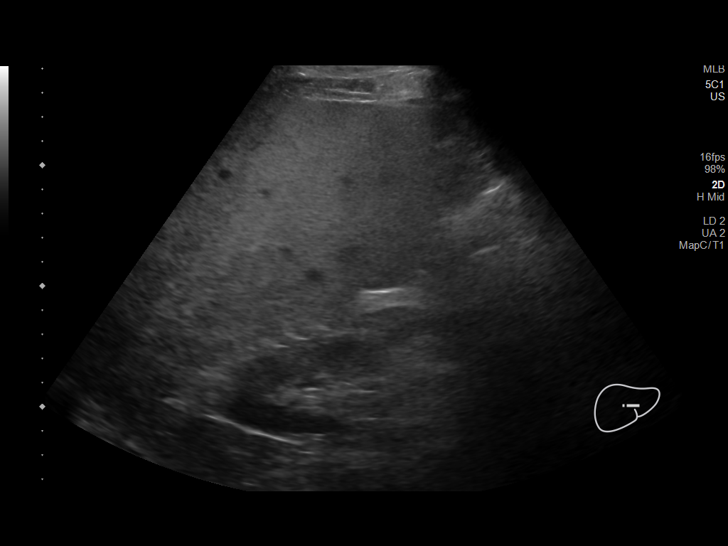
[im 29/39]
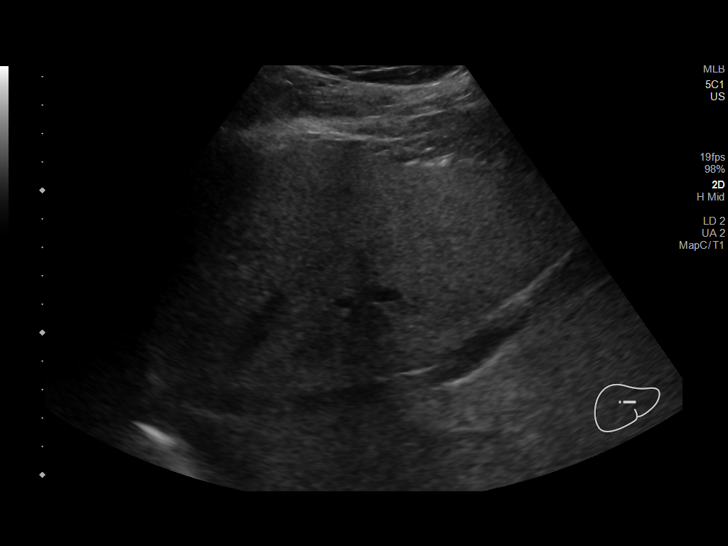
[im 32/39]
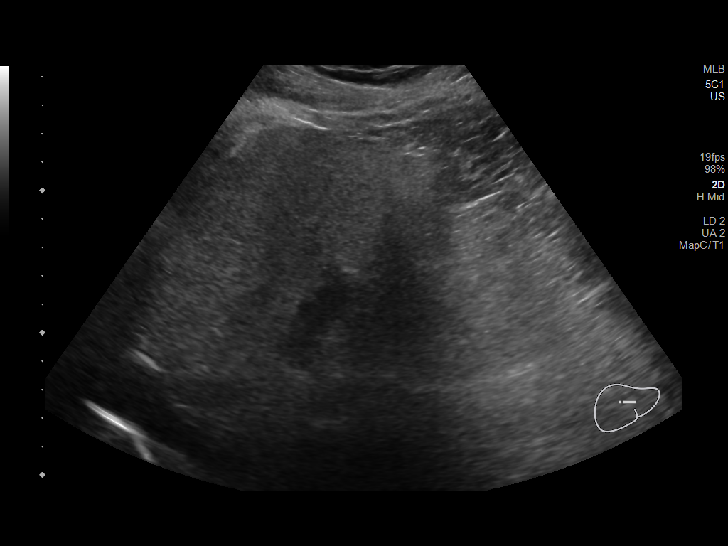
[im 35/39]
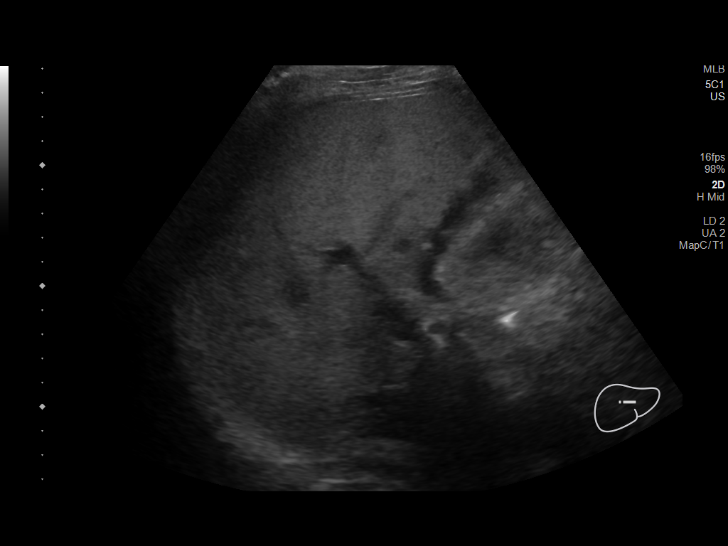
[im 39/39]
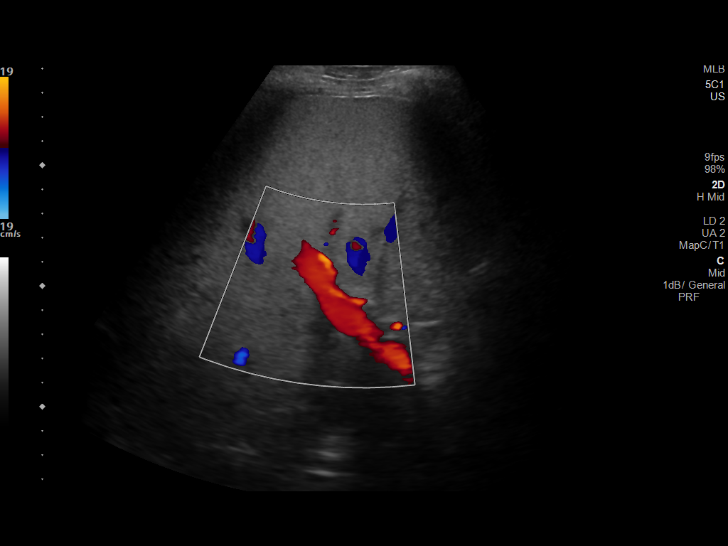

[14 of 25 positions shown; findings below may reference images not displayed]

FINDINGS: Gallbladder:

No gallstones or wall thickening visualized. No sonographic Murphy
sign noted by sonographer.

Common bile duct:

Diameter: Normal caliber, 4 mm

Liver:

Heterogeneous, increased echotexture compatible with fatty
infiltration. No focal abnormality or biliary ductal dilatation.
Portal vein is patent on color Doppler imaging with normal direction
of blood flow towards the liver.

Other: None.
IMPRESSION: Fatty liver.  No acute findings.
